# Patient Record
Sex: Female | Born: 1970 | Race: White | Hispanic: No | Marital: Married | Smoking: Never smoker
Health system: Southern US, Community
[De-identification: ages and names within clinical notes are randomized; demographics above are authoritative.]

## PROBLEM LIST (undated history)

## (undated) DIAGNOSIS — N952 Postmenopausal atrophic vaginitis: Secondary | ICD-10-CM

## (undated) HISTORY — PX: ABDOMINAL HYSTERECTOMY: SHX81

## (undated) HISTORY — DX: Postmenopausal atrophic vaginitis: N95.2

---

## 2006-12-17 ENCOUNTER — Encounter: Admission: RE | Admit: 2006-12-17 | Discharge: 2006-12-17 | Payer: Self-pay | Admitting: Family Medicine

## 2014-03-13 LAB — HEPATIC FUNCTION PANEL
ALT: 17 U/L (ref 7–35)
AST: 21 U/L (ref 13–35)
Alkaline Phosphatase: 70 U/L (ref 25–125)
BILIRUBIN, TOTAL: 0.2 mg/dL

## 2014-03-13 LAB — TSH: TSH: 2.54 u[IU]/mL (ref <=5.90)

## 2014-03-13 LAB — HEMOGLOBIN A1C: Hgb A1c MFr Bld: 5.3 % (ref 4.0–6.0)

## 2014-03-13 LAB — CBC AND DIFFERENTIAL
HCT: 40 % (ref 36–46)
Hemoglobin: 13.6 g/dL (ref 12.0–16.0)
PLATELETS: 147 10*3/uL — AB (ref 150–399)
WBC: 5 10*3/mL

## 2014-03-13 LAB — BASIC METABOLIC PANEL
BUN: 17 mg/dL (ref 4–21)
Creatinine: 0.9 mg/dL (ref 0.5–1.1)
Glucose: 74 mg/dL
POTASSIUM: 4.4 mmol/L (ref 3.4–5.3)
Sodium: 142 mmol/L (ref 137–147)

## 2014-03-26 ENCOUNTER — Ambulatory Visit (INDEPENDENT_AMBULATORY_CARE_PROVIDER_SITE_OTHER): Payer: BC Managed Care – PPO | Admitting: Physician Assistant

## 2014-03-26 ENCOUNTER — Encounter: Payer: Self-pay | Admitting: Physician Assistant

## 2014-03-26 VITALS — BP 107/64 | HR 66 | Ht 64.5 in | Wt 156.0 lb

## 2014-03-26 DIAGNOSIS — M533 Sacrococcygeal disorders, not elsewhere classified: Secondary | ICD-10-CM | POA: Insufficient documentation

## 2014-03-26 DIAGNOSIS — D696 Thrombocytopenia, unspecified: Secondary | ICD-10-CM | POA: Diagnosis not present

## 2014-03-26 DIAGNOSIS — N951 Menopausal and female climacteric states: Secondary | ICD-10-CM | POA: Diagnosis not present

## 2014-03-26 DIAGNOSIS — Z13220 Encounter for screening for lipoid disorders: Secondary | ICD-10-CM

## 2014-03-26 LAB — LIPID PANEL
Cholesterol: 198 mg/dL (ref 0–200)
HDL: 80 mg/dL (ref >39)
LDL CALC: 103 mg/dL — AB (ref 0–99)
Total CHOL/HDL Ratio: 2.5 Ratio
Triglycerides: 73 mg/dL (ref <150)
VLDL: 15 mg/dL (ref 0–40)

## 2014-03-26 MED ORDER — DICLOFENAC SODIUM 1 % TD GEL: 4.0000 g | Freq: Four times a day (QID) | TRANSDERMAL | Status: DC

## 2014-03-26 NOTE — Patient Instructions (Signed)
Sacroiliac Joint Dysfunction °The sacroiliac joint connects the lower part of the spine (the sacrum) with the bones of the pelvis. °CAUSES  °Sometimes, there is no obvious reason for sacroiliac joint dysfunction. Other times, it may occur  °· During pregnancy. °· After injury, such as: °¨ Car accidents. °¨ Sport-related injuries. °¨ Work-related injuries. °· Due to one leg being shorter than the other. °· Due to other conditions that affect the joints, such as: °¨ Rheumatoid arthritis. °¨ Gout. °¨ Psoriasis. °¨ Joint infection (septic arthritis). °SYMPTOMS  °Symptoms may include: °· Pain in the: °¨ Lower back. °¨ Buttocks. °¨ Groin. °¨ Thighs and legs. °· Difficult sitting, standing, walking, lying, bending or lifting. °DIAGNOSIS  °A number of tests may be used to help diagnose the cause of sacroiliac joint dysfunction, including: °· Imaging tests to look for other causes of pain, including: °¨ MRI. °¨ CT scan. °¨ Bone scan. °· Diagnostic injection: During a special x-ray (called fluoroscopy), a needle is put into the sacroiliac joint. A numbing medicine is injected into the joint. If the pain is improved or stopped, the diagnosis of sacroiliac joint dysfunction is more likely. °TREATMENT  °There are a number of types of treatment used for sacroiliac joint dysfunction, including: °· Only take over-the-counter or prescription medicines for pain, discomfort, or fever as directed by your caregiver. °· Medications to relax muscles. °· Rest. Decreasing activity can help cut down on painful muscle spasms and allow the back to heal. °· Application of heat or ice to the lower back may improve muscle spasms and soothe pain. °· Brace. A special back brace, called a sacroiliac belt, can help support the joint while your back is healing. °· Physical therapy can help teach comfortable positions and exercises to strengthen muscles that support the sacroiliac joint. °· Cortisone injections. Injections of steroid medicine into the  joint can help decrease swelling and improve pain. °· Hyaluronic acid injections. This chemical improves lubrication within the sacroiliac joint, thereby decreasing pain. °· Radiofrequency ablation. A special needle is placed into the joint, where it burns away nerves that are carrying pain messages from the joint. °· Surgery. Because pain occurs during movement of the joint, screws and plates may be installed in order to limit or prevent joint motion. °HOME CARE INSTRUCTIONS  °· Take all medications exactly as directed. °· Follow instructions regarding both rest and physical activity, to avoid worsening the pain. °· Do physical therapy exercises exactly as prescribed. °SEEK IMMEDIATE MEDICAL CARE IF: °· You experience increasingly severe pain. °· You develop new symptoms, such as numbness or tingling in your legs or feet. °· You lose bladder or bowel control. °Document Released: 09/04/2008 Document Revised: 08/31/2011 Document Reviewed: 09/04/2008 °ExitCare® Patient Information ©2015 [employer]. This information is not intended to replace advice given to you by your health care provider. Make sure you discuss any questions you have with your health care provider. ° °

## 2014-03-27 NOTE — Progress Notes (Signed)
   Subjective:    Patient ID: Tracy Franklin, female    DOB: 07/26/1970, 43 y.o.   MRN: [id number]  HPI Pt is a [age] yo female who presents to the clinic to establish care.   PMH positive for menopausal symptoms. Pt had complete hysterectomy in 2002 for painful heavy periods. She has since had symptoms. Tried HRT for a little while and hated it. Then found cream through [facility] and loved it. Used for 10 years. Just recently discontinued. She has now gone to hormone specialist and working with them to figure something out.   She is also having some occasional right low back pain. Sometimes radiates into buttocks. Not really tried anything for it. Comes and goes. She is very active and notices hurts worse after certain activities.    Review of Systems  All other systems reviewed and are negative.      Objective:   Physical Exam  Constitutional: She is oriented to person, place, and time. She appears well-developed and well-nourished.  HENT:  Head: Normocephalic and atraumatic.  Cardiovascular: Normal rate, regular rhythm and normal heart sounds.   Pulmonary/Chest: Effort normal and breath sounds normal. She has no wheezes.  Musculoskeletal:  Normal ROM at waist.  Tenderness to palpation over right SI joint.  Negative straight leg test.  Normal bilateral patellar reflexes.   Neurological: She is alert and oriented to person, place, and time.  Skin: Skin is dry.  Psychiatric: She has a normal mood and affect. Her behavior is normal.          Assessment & Plan:  SI joint dysfunction- Ibuprofen as needed. Give voltaren gel to try over the area. Stretches given to start to rehabilitate.declined imaging today. RTC if not improving or if symptoms worsening.   Menopausal symptoms- pt is actually  working with a specialist and they are composting a plan of action for bio-identical hormones. She brings in labs.  Low platelets- platelets a little low. She does admit to easy bruising. This  certainly could be the cause. No other abnormality on cbc. Recommend recheck in 1-2 months to make sure no other changes on CBC.   Vitamin D insuffiencey taking OTC vit d 2000units currently.   Screening lab for fasting cholesterol. Never drawn needs drawn.    Pt declines flu shot today.

## 2014-03-28 ENCOUNTER — Encounter: Payer: Self-pay | Admitting: Physician Assistant

## 2014-05-07 ENCOUNTER — Ambulatory Visit (INDEPENDENT_AMBULATORY_CARE_PROVIDER_SITE_OTHER): Payer: BC Managed Care – PPO | Admitting: Physician Assistant

## 2014-05-07 ENCOUNTER — Encounter: Payer: Self-pay | Admitting: Physician Assistant

## 2014-05-07 VITALS — BP 107/55 | HR 69 | Ht 65.5 in | Wt 154.0 lb

## 2014-05-07 DIAGNOSIS — Z23 Encounter for immunization: Secondary | ICD-10-CM | POA: Diagnosis not present

## 2014-05-07 DIAGNOSIS — Z7184 Encounter for health counseling related to travel: Secondary | ICD-10-CM

## 2014-05-07 DIAGNOSIS — Z7189 Other specified counseling: Secondary | ICD-10-CM | POA: Diagnosis not present

## 2014-05-07 DIAGNOSIS — N951 Menopausal and female climacteric states: Secondary | ICD-10-CM | POA: Insufficient documentation

## 2014-05-07 MED ORDER — ATOVAQUONE-PROGUANIL HCL 250-100 MG PO TABS: 1.0000 | ORAL_TABLET | Freq: Every day | ORAL | Status: DC

## 2014-05-07 MED ORDER — TYPHOID VACCINE PO CPDR: 1.0000 | DELAYED_RELEASE_CAPSULE | ORAL | Status: DC

## 2014-05-07 NOTE — Progress Notes (Signed)
   Subjective:    Patient ID: Tracy Franklin, female    DOB: November 27, 1970, 43 y.o.   MRN: [id number]  HPI Patient is a 43 year old female who presents to the clinic to discuss vaccines for a mission sure that she will go on in mid January for 10 days to [place]. 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   Review of Systems  All other systems reviewed and are negative.      Objective:   Physical Exam  Constitutional: She is oriented to person, place, and time. She appears well-developed and well-nourished.  HENT:  Head: Normocephalic and atraumatic.  Cardiovascular: Normal rate, regular rhythm and normal heart sounds.   Neurological: She is alert and oriented to person, place, and time.  Psychiatric: She has a normal mood and affect. Her behavior is normal.          Assessment & Plan:  Vaccines for travel- Tdap up to date. flu shot given today with no complication. First dose of hep A and hep B given today. She will come back in 4 weeks for second dose of hep B. She is aware that she will not be able complete series before she travels. She was given typhoid oral vaccine as well as malaria preventative vaccine for 2 days before and 7 days after trip. Reminded to bring DEET bug spray.

## 2014-06-06 ENCOUNTER — Ambulatory Visit (INDEPENDENT_AMBULATORY_CARE_PROVIDER_SITE_OTHER): Payer: BC Managed Care – PPO | Admitting: Physician Assistant

## 2014-06-06 DIAGNOSIS — Z23 Encounter for immunization: Secondary | ICD-10-CM

## 2014-06-06 NOTE — Progress Notes (Signed)
   Subjective:    Patient ID: Tracy Franklin, female    DOB: 03-31-1971, 43 y.o.   MRN: [id number]  HPI  Delice Bisonara is here for Twinrix vaccine.   Review of Systems     Objective:   Physical Exam        Assessment & Plan:  Patient tolerated injection well without complications. Patient advised to schedule next injection 150 days from today.

## 2014-07-17 ENCOUNTER — Ambulatory Visit (INDEPENDENT_AMBULATORY_CARE_PROVIDER_SITE_OTHER): Payer: BLUE CROSS/BLUE SHIELD | Admitting: Physician Assistant

## 2014-07-17 ENCOUNTER — Encounter: Payer: Self-pay | Admitting: Physician Assistant

## 2014-07-17 VITALS — BP 114/76 | HR 73 | Temp 98.0°F | Ht 65.5 in | Wt 154.0 lb

## 2014-07-17 DIAGNOSIS — A499 Bacterial infection, unspecified: Secondary | ICD-10-CM | POA: Diagnosis not present

## 2014-07-17 DIAGNOSIS — J329 Chronic sinusitis, unspecified: Secondary | ICD-10-CM

## 2014-07-17 DIAGNOSIS — B9689 Other specified bacterial agents as the cause of diseases classified elsewhere: Secondary | ICD-10-CM

## 2014-07-17 DIAGNOSIS — R21 Rash and other nonspecific skin eruption: Secondary | ICD-10-CM | POA: Diagnosis not present

## 2014-07-17 MED ORDER — HYDROCODONE-HOMATROPINE 5-1.5 MG/5ML PO SYRP: 5.0000 mL | ORAL_SOLUTION | Freq: Every evening | ORAL | Status: DC | PRN

## 2014-07-17 MED ORDER — AMOXICILLIN-POT CLAVULANATE 875-125 MG PO TABS: 1.0000 | ORAL_TABLET | Freq: Two times a day (BID) | ORAL | Status: DC

## 2014-07-17 MED ORDER — PREDNISONE 50 MG PO TABS: ORAL_TABLET | ORAL | Status: DC

## 2014-07-17 NOTE — Patient Instructions (Signed)

## 2014-07-17 NOTE — Progress Notes (Signed)
   Subjective:    Patient ID: Tracy Franklin, female    DOB: 28-Aug-1970, 44 y.o.   MRN: [id number]  HPI  Patient is a [age] year old female who presents to the clinic with cough, headache, nasal congestion. She just got back from a 10 day trip from [place]. There was a lot of dust in the country and she had experienced a lot of temperature changes. She started getting sick in [place] on the flight back. She has tons of sinus pressure, facial pain and her cough seems to be worsening. It is somewhat productive but she's having trouble getting stuffed up. She denies any wheezing but her chest does feel tight and short of breath. She's tried over-the-counter cough syrup, Alka-Seltzer plus nighttime with little benefit. She also noticed a rash that started this morning that is itchy over her torso and hands. She does not remember any type of insect bite. She is currently on malaria prophylaxis.   Review of Systems  All other systems reviewed and are negative.      Objective:   Physical Exam  Constitutional: She is oriented to person, place, and time. She appears well-developed and well-nourished.  HENT:  Head: Normocephalic and atraumatic.  Right Ear: External ear normal.  Left Ear: External ear normal.  Mouth/Throat: Oropharynx is clear and moist.  TMs are slightly bulging with some erythema. No blood or pus noted.  There is maxillary tenderness to palpation over sinuses.  Bilateral nares are red and swollen turbinates with some rhinorrhea.  Eyes: Conjunctivae are normal. Right eye exhibits no discharge. Left eye exhibits no discharge.  Neck: Normal range of motion. Neck supple.  Cardiovascular: Normal rate, regular rhythm and normal heart sounds.   Pulmonary/Chest: Effort normal and breath sounds normal. She has no wheezes.  Lymphadenopathy:    She has no cervical adenopathy.  Neurological: She is alert and oriented to person, place, and time.  Skin:  There is a macular flat erythematous rash  over neck, torso and hands. No lesions or vesicles. No nodules or plaques.  Psychiatric: She has a normal mood and affect. Her behavior is normal.          Assessment & Plan:  Acute sinuitis- given Augmentin for 10 days. For cough given Hycodan to use at night. Encouraged cough drops with honey and lemon. The day. Gave handout further symptomatic care. Discussed adding Mucinex D for some mucus anything and relief.  Rash- unclear etiology. Appears almost like a drug reaction vs viral exanthem. Pt been on malaria prophylaxis. Pt has been out of country and could have been bitten by something like caused rash. For itch use OTC hydrocortisone cream. Prednisone given if not clearing or becoming bothersome in next couple of days.

## 2014-07-18 ENCOUNTER — Encounter: Payer: Self-pay | Admitting: Physician Assistant

## 2014-09-04 ENCOUNTER — Encounter: Payer: Self-pay | Admitting: Physician Assistant

## 2014-11-02 ENCOUNTER — Encounter: Payer: Self-pay | Admitting: Sports Medicine

## 2014-11-02 ENCOUNTER — Ambulatory Visit (INDEPENDENT_AMBULATORY_CARE_PROVIDER_SITE_OTHER): Payer: BLUE CROSS/BLUE SHIELD | Admitting: Sports Medicine

## 2014-11-02 ENCOUNTER — Ambulatory Visit (INDEPENDENT_AMBULATORY_CARE_PROVIDER_SITE_OTHER): Payer: BLUE CROSS/BLUE SHIELD

## 2014-11-02 VITALS — BP 115/71 | HR 67 | Ht 65.0 in | Wt 156.0 lb

## 2014-11-02 DIAGNOSIS — M7752 Other enthesopathy of left foot: Secondary | ICD-10-CM

## 2014-11-02 DIAGNOSIS — M545 Low back pain: Secondary | ICD-10-CM

## 2014-11-02 DIAGNOSIS — M25572 Pain in left ankle and joints of left foot: Secondary | ICD-10-CM | POA: Diagnosis not present

## 2014-11-02 DIAGNOSIS — M47816 Spondylosis without myelopathy or radiculopathy, lumbar region: Secondary | ICD-10-CM | POA: Insufficient documentation

## 2014-11-02 DIAGNOSIS — M5416 Radiculopathy, lumbar region: Secondary | ICD-10-CM

## 2014-11-02 DIAGNOSIS — M7750 Other enthesopathy of unspecified foot: Secondary | ICD-10-CM | POA: Insufficient documentation

## 2014-11-02 MED ORDER — MELOXICAM 15 MG PO TABS: ORAL_TABLET | ORAL | Status: DC

## 2014-11-02 NOTE — Progress Notes (Signed)
   Subjective:    I'm seeing this patient as a consultation for:  Tandy GawJade Breeback, PA-C  CC: Left foot pain  HPI: For the past couple of weeks this pleasant 44 year old female has had increasing pain and she localizes just deep to the posterior heel, moderate, persistent, she is highly active with spin classes, and has noted worsening of symptoms during these episodes. Pain does not radiate.  She also endorses mild back pain with numbness and tingling radiating down the left leg directly in an S1 distribution. No bowel or bladder dysfunction or saddle numbness, no constitutional symptoms.  Past medical history, Surgical history, Family history not pertinant except as noted below, Social history, Allergies, and medications have been entered into the medical record, reviewed, and no changes needed.   Review of Systems: No headache, visual changes, nausea, vomiting, diarrhea, constipation, dizziness, abdominal pain, skin rash, fevers, chills, night sweats, weight loss, swollen lymph nodes, body aches, joint swelling, muscle aches, chest pain, shortness of breath, mood changes, visual or auditory hallucinations.   Objective:   General: Well Developed, well nourished, and in no acute distress.  Neuro/Psych: Alert and oriented x3, extra-ocular muscles intact, able to move all 4 extremities, sensation grossly intact. Skin: Warm and dry, no rashes noted.  Respiratory: Not using accessory muscles, speaking in full sentences, trachea midline.  Cardiovascular: Pulses palpable, no extremity edema. Abdomen: Does not appear distended. Back Exam:  Inspection: Unremarkable  Motion: Flexion 45 deg, Extension 45 deg, Side Bending to 45 deg bilaterally,  Rotation to 45 deg bilaterally  SLR laying: Negative  XSLR laying: Negative  Palpable tenderness: None. FABER: negative. Sensory change: Gross sensation intact to all lumbar and sacral dermatomes.  Reflexes: 2+ at both patellar tendons, 2+ at achilles  tendons, Babinski's downgoing.  Strength at foot  Plantar-flexion: 5/5 Dorsi-flexion: 5/5 Eversion: 5/5 Inversion: 5/5  Leg strength  Quad: 5/5 Hamstring: 5/5 Hip flexor: 5/5 Hip abductors: 5/5  Gait unremarkable. Left Foot: No visible erythema or swelling. Range of motion is full in all directions. Strength is 5/5 in all directions. No hallux valgus. No pes cavus or pes planus. No abnormal callus noted. No pain over the navicular prominence, or base of fifth metatarsal. No tenderness to palpation of the calcaneal insertion of plantar fascia. No pain at the Achilles insertion. No pain over the calcaneal bursa. Positive reproduction of pain at the retrocalcaneal bursa. No tenderness to palpation over the tarsals, metatarsals, or phalanges. No hallux rigidus or limitus. No tenderness palpation over interphalangeal joints. No pain with compression of the metatarsal heads. Neurovascularly intact distally.  Impression and Recommendations:   This case required medical decision making of moderate complexity.

## 2014-11-02 NOTE — Assessment & Plan Note (Signed)
Left S1.  Formal PT, meloxicam, x-rays.

## 2014-11-02 NOTE — Assessment & Plan Note (Signed)
X-rays, formal physical therapy, meloxicam. Heel lifts. Formal physical therapy.

## 2014-11-05 ENCOUNTER — Ambulatory Visit: Payer: BC Managed Care – PPO

## 2014-11-14 ENCOUNTER — Ambulatory Visit (INDEPENDENT_AMBULATORY_CARE_PROVIDER_SITE_OTHER): Payer: BLUE CROSS/BLUE SHIELD | Admitting: Rehabilitative and Restorative Service Providers"

## 2014-11-14 DIAGNOSIS — R29898 Other symptoms and signs involving the musculoskeletal system: Secondary | ICD-10-CM

## 2014-11-14 DIAGNOSIS — M25572 Pain in left ankle and joints of left foot: Secondary | ICD-10-CM

## 2014-11-14 DIAGNOSIS — M545 Low back pain, unspecified: Secondary | ICD-10-CM

## 2014-11-14 DIAGNOSIS — M25659 Stiffness of unspecified hip, not elsewhere classified: Secondary | ICD-10-CM

## 2014-11-14 DIAGNOSIS — M79605 Pain in left leg: Secondary | ICD-10-CM

## 2014-11-14 NOTE — Patient Instructions (Signed)
Trunk: Prone Extension (Press-Ups)   Lie on stomach on firm, flat surface. Relax bottom and legs. Raise chest in air with elbows straight. Keep hips flat on surface, sag stomach. Hold _1___ seconds. Repeat __10__ times. Do __2-3__ sessions per day. CAUTION: Movement should be gentle and slow.  Piriformis Stretch   Lying on back, pull right knee toward opposite shoulder. Hold __20-30__ seconds. Repeat __3__ times. Do __2-3__ sessions per day.   SEATED Gastroc / Heel Cord Stretch - Seated With Towel  Achilles / Gastroc, Standing   Stand, right foot behind, heel on floor and turned slightly out, leg straight, forward leg bent. Move hips forward. Hold _30__ seconds. Repeat _3__ times per session. Do _3__ sessions per day.   Stretching: Soleus   Stand with right foot back, both knees bent. Keeping heel on floor, turned slightly out, lean into wall until stretch is felt in lower calf. Hold _30___ seconds. Repeat __3__ times per set. Do __3__ sessions per day.   Abdominal Bracing With Pelvic Floor (Hook-Lying)   With neutral spine, tighten pelvic floor and abdominals. Hold 10 seconds. Repeat __10_ times. Do _1__ times a day.   [facility] [address] [place], [place] [address]  [phone] (office) [phone] (fax)

## 2014-11-14 NOTE — Patient Instructions (Signed)
Trunk: Prone Extension (Press-Ups)   Lie on stomach on firm, flat surface. Relax bottom and legs. Raise chest in air with elbows straight. Keep hips flat on surface, sag stomach. Hold _1___ seconds. Repeat __10__ times. Do __2-3__ sessions per day. CAUTION: Movement should be gentle and slow.  Piriformis Stretch   Lying on back, pull right knee toward opposite shoulder. Hold __20-30__ seconds. Repeat __3__ times. Do __2-3__ sessions per day.   SEATED Gastroc / Heel Cord Stretch - Seated With Towel  Achilles / Gastroc, Standing   Stand, right foot behind, heel on floor and turned slightly out, leg straight, forward leg bent. Move hips forward. Hold _30__ seconds. Repeat _3__ times per session. Do _3__ sessions per day.   Stretching: Soleus   Stand with right foot back, both knees bent. Keeping heel on floor, turned slightly out, lean into wall until stretch is felt in lower calf. Hold _30___ seconds. Repeat __3__ times per set. Do __3__ sessions per day.   Abdominal Bracing With Pelvic Floor (Hook-Lying)   With neutral spine, tighten pelvic floor and abdominals. Hold 10 seconds. Repeat __10_ times. Do _1__ times a day.   [facility] [address] [place], [place] [address]  [phone] (office) [phone] (fax)   

## 2014-11-15 NOTE — Therapy (Addendum)
[place], [place], [address] Phone: [phone]   Fax:  [phone]  Physical Therapy Evaluation  Patient Details  Name: Tracy Franklin MRN: [id number] Date of Birth: 06-28-1970 Referring Provider:  Silverio Decamp,*  Encounter Date: 11/14/2014      PT End of Session - 11/14/14 1709    Visit Number 1   Number of Visits 8   Date for PT Re-Evaluation 01/04/15   PT Start Time 0356   PT Stop Time 0500   PT Time Calculation (min) 64 min   Activity Tolerance Patient tolerated treatment well;No increased pain      No past medical history on file.  Past Surgical History  Procedure Laterality Date  . Abdominal hysterectomy      There were no vitals filed for this visit.  Visit Diagnosis:  Pain in joint, ankle and foot, left - Plan: PT plan of care cert/re-cert  Lumbar pain with radiation down left leg - Plan: PT plan of care cert/re-cert  Weakness of left hip - Plan: PT plan of care cert/re-cert  Stiffness of hip joint, unspecified laterality - Plan: PT plan of care cert/re-cert      Subjective Assessment - 11/14/14 1604    Subjective Pt reports onset of Lt heel pain ~ 5 weeks ago when cycyling intensity increased stopped exercise ~ 10 days ago seen by MD - Also reports fall ~10 years ago striking L hip/buttock with continued symptoms inc LBP, intermittent Lt hip pain and tingling in laterar foot into the little toe    Pertinent History Now cycling again has adjusted foot pedel wearing tennis shoes per me advise.No treatment following fall - symptoms have continued but have not limited activity   How long can you sit comfortably? 30 minutes   How long can you stand comfortably? 10-15 minutes   How long can you walk comfortably? 10 minutes   Diagnostic tests xrays - foot and back    Patient Stated Goals to be able to resume exercise without pain; improve symptoms LB and LT LE   Currently  in Pain? Yes   Pain Score 3    Pain Location Ankle   Pain Orientation Left   Pain Descriptors / Indicators Aching;Sharp   Pain Type Acute pain   Pain Onset More than a month ago   Pain Frequency Constant   Aggravating Factors  cycling, point toes down, walking    Pain Relieving Factors avoiding activities that irritate symptoms, NSAIDS, essential oils   Effect of Pain on Daily Activities changes to ADL's - re- exercise and walking    Multiple Pain Sites Yes            [facility] PT Assessment - 11/14/14 1726    Assessment   Medical Diagnosis lt lumbar radiculopathy; retrocalcaneal bursitis lt   Onset Date/Surgical Date 10/10/14   Hand Dominance Right   Prior Function   Level of Independence Independent with basic ADLs   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    Leisure exercise - spin/bike/weights   Observation/Other Assessments   Focus on Therapeutic Outcomes (FOTO)  37% limitation   Sensation   Additional Comments intermittent tingling Lt lateral foot into little toe   Posture/Postural Control   Posture/Postural Control No significant limitations   Posture Comments symmetrical at boney landmarks   AROM   Right Ankle Dorsiflexion 5   Right Ankle Plantar Flexion 65   Right Ankle Inversion 35  Right Ankle Eversion 30   Left Ankle Dorsiflexion 3   Left Ankle Plantar Flexion 60   Left Ankle Inversion 30   Left Ankle Eversion 22   Strength   Overall Strength Comments LE strength 5/5 throughout B LE's; except L hip abduction and extension 4+/5   Flexibility   Soft Tissue Assessment /Muscle Length yes   Piriformis tightness noted bilaterally   Quadratus Lumborum tight Rt   Palpation   Spinal mobility WFL's PA spring testing lumbar spine   SI assessment  pain Rt SI with spring testing    Special Tests    Special Tests --  tightness with piriformis stretch position bilat Lt > Rt   Ambulation/Gait   Gait Comments WNL's - no abnormal gait pattern  noted                           PT Education - 11/14/14 1707    Education provided Yes   Education Details Education re- lumbar spine pathology, foot and ankle pain; HEP; activities to avoid; footwear   Person(s) Educated Patient   Methods Explanation;Demonstration;Tactile cues;Verbal cues;Handout   Comprehension Verbalized understanding;Returned demonstration;Verbal cues required          PT Short Term Goals - 11/14/14 1716    PT SHORT TERM GOAL #1   Title Pt independent in initial HEP   Time 3   Period Weeks   Status New   PT SHORT TERM GOAL #2   Title Increase hip mobility through piriformis bilaterally   Time 4   Period Weeks   Status New   PT SHORT TERM GOAL #3   Title decrease pain Lt ankle/foot to 1-2/10 for functional activities and ambulation   Time 4   Period Weeks   Status New           PT Long Term Goals - 11/14/14 1738    PT LONG TERM GOAL #1   Title Improve core stability allowing pt to preform functional activities without pain   Time 8   Period Weeks   Status New   PT LONG TERM GOAL #2   Title Increase strength L hip abd/ext to 5/5   Time 8   Period Weeks   Status New   PT LONG TERM GOAL #3   Title Increase ankle ROM Lt to equal Rt   Time 8   Period Weeks   Status New   PT LONG TERM GOAL #4   Title Decrease FOTO score to 25 or less   Time 8   Period Weeks   Status New               Plan - 11/14/14 1711    Clinical Impression Statement Pt presents with acute ankle/heel pain likely related to spin exercise on cycle; chronic Lt lumbar radicular symptoms over the past 10 years following a fall - with intermittent symptoms    Pt will benefit from skilled therapeutic intervention in order to improve on the following deficits Decreased range of motion;Increased fascial restricitons;Decreased activity tolerance;Pain;Impaired flexibility;Decreased strength   Rehab Potential Excellent   PT Frequency 1x / week   PT  Duration 8 weeks   PT Treatment/Interventions ADLs/Self Care Home Management;Cryotherapy;Electrical Stimulation;Iontophoresis 28m/ml Dexamethasone;Moist Heat;Ultrasound;Therapeutic activities;Therapeutic exercise;Neuromuscular re-education;Patient/family education;Manual techniques   PT Next Visit Plan Patient will be seen for re-check of current exercises; progression of HEP; stretching piriformis, gastroc, soleus; core stabilization   PT Home Exercise Plan HEP  Consulted and Agree with Plan of Care Patient         Problem List Patient Active Problem List   Diagnosis Date Noted  . Retrocalcaneal bursitis 11/02/2014  . Left lumbar radiculitis 11/02/2014  . Menopausal symptoms 05/07/2014  . SI (sacroiliac) joint dysfunction 03/26/2014    [place] Nilda Simmer, PT, MPH 11/15/2014, 8:08 AM  [facility] [place] [address] [place], [place], [address] Phone: [phone]   Fax:  [phone]    PHYSICAL THERAPY DISCHARGE SUMMARY  Visits from Start of Care: 1  Current functional level related to goals / functional outcomes: Seen for Initial Evaluation only   Remaining deficits: Unknown   Education / Equipment: HEP  Plan: Patient agrees to discharge.  Patient goals were not met. Patient is being discharged due to not returning since the last visit.  ?????   Irvin Bastin P. Helene Kelp, PT. MPH 01/11/15 11:00am

## 2014-11-22 ENCOUNTER — Encounter: Payer: BLUE CROSS/BLUE SHIELD | Admitting: Rehabilitative and Restorative Service Providers"

## 2014-12-04 ENCOUNTER — Ambulatory Visit: Payer: BLUE CROSS/BLUE SHIELD | Admitting: Sports Medicine

## 2014-12-11 ENCOUNTER — Ambulatory Visit: Payer: BLUE CROSS/BLUE SHIELD | Admitting: Sports Medicine

## 2014-12-17 ENCOUNTER — Ambulatory Visit (INDEPENDENT_AMBULATORY_CARE_PROVIDER_SITE_OTHER): Payer: BLUE CROSS/BLUE SHIELD | Admitting: Sports Medicine

## 2014-12-17 ENCOUNTER — Encounter: Payer: Self-pay | Admitting: Sports Medicine

## 2014-12-17 DIAGNOSIS — M5416 Radiculopathy, lumbar region: Secondary | ICD-10-CM

## 2014-12-17 DIAGNOSIS — M7752 Other enthesopathy of left foot: Secondary | ICD-10-CM | POA: Diagnosis not present

## 2014-12-17 NOTE — Progress Notes (Signed)
  Subjective:    CC: Follow-up  HPI: Left lumbar developed the: Clinical S1, she did have some L3-L4 spondylolisthesis, unfortunately pain is continued despite physical therapy.  Retrocalcaneal bursitis, left: Improved with physical therapy but persistent.  Past medical history, Surgical history, Family history not pertinant except as noted below, Social history, Allergies, and medications have been entered into the medical record, reviewed, and no changes needed.   Review of Systems: No fevers, chills, night sweats, weight loss, chest pain, or shortness of breath.   Objective:    General: Well Developed, well nourished, and in no acute distress.  Neuro: Alert and oriented x3, extra-ocular muscles intact, sensation grossly intact.  HEENT: Normocephalic, atraumatic, pupils equal round reactive to light, neck supple, no masses, no lymphadenopathy, thyroid nonpalpable.  Skin: Warm and dry, no rashes. Cardiac: Regular rate and rhythm, no murmurs rubs or gallops, no lower extremity edema.  Respiratory: Clear to auscultation bilaterally. Not using accessory muscles, speaking in full sentences. Left Ankle: No visible erythema or swelling. Range of motion is full in all directions. Strength is 5/5 in all directions. Stable lateral and medial ligaments; squeeze test and kleiger test unremarkable; Talar dome nontender; No pain at base of 5th MT; No tenderness over cuboid; No tenderness over N spot or navicular prominence No tenderness on posterior aspects of lateral and medial malleolus No sign of peroneal tendon subluxations; Negative tarsal tunnel tinel's Still with tenderness just deep to the Achilles insertion  Impression and Recommendations:

## 2014-12-17 NOTE — Assessment & Plan Note (Signed)
Improved but persistent despite 4-6 weeks of formal physical therapy as well as oral medications. Before injection I do confirm the diagnosis. MR left foot

## 2014-12-17 NOTE — Assessment & Plan Note (Signed)
Left-sided S1 with some upper lumbar spondylolisthesis. Again, has failed 6 weeks of formal physical therapy as well as oral medications. MRI for interventional planning.

## 2014-12-25 ENCOUNTER — Ambulatory Visit (INDEPENDENT_AMBULATORY_CARE_PROVIDER_SITE_OTHER): Payer: BLUE CROSS/BLUE SHIELD

## 2014-12-25 DIAGNOSIS — M5126 Other intervertebral disc displacement, lumbar region: Secondary | ICD-10-CM

## 2014-12-25 DIAGNOSIS — M5416 Radiculopathy, lumbar region: Secondary | ICD-10-CM

## 2014-12-25 DIAGNOSIS — M4686 Other specified inflammatory spondylopathies, lumbar region: Secondary | ICD-10-CM

## 2014-12-25 DIAGNOSIS — M7752 Other enthesopathy of left foot: Secondary | ICD-10-CM | POA: Diagnosis not present

## 2015-01-03 ENCOUNTER — Ambulatory Visit (INDEPENDENT_AMBULATORY_CARE_PROVIDER_SITE_OTHER): Payer: BLUE CROSS/BLUE SHIELD | Admitting: Sports Medicine

## 2015-01-03 ENCOUNTER — Encounter: Payer: Self-pay | Admitting: Sports Medicine

## 2015-01-03 VITALS — BP 110/71 | HR 58 | Wt 160.0 lb

## 2015-01-03 DIAGNOSIS — M7752 Other enthesopathy of left foot: Secondary | ICD-10-CM

## 2015-01-03 DIAGNOSIS — M47816 Spondylosis without myelopathy or radiculopathy, lumbar region: Secondary | ICD-10-CM | POA: Diagnosis not present

## 2015-01-03 NOTE — Assessment & Plan Note (Signed)
MRI does show an element of retrocalcaneal bursitis, there is a significant effusion posterior to the talus, posterior ankle recess. Injection placed into the recess, fluid does track into the retrocalcaneal bursa. Return in one month.

## 2015-01-03 NOTE — Assessment & Plan Note (Signed)
Bilateral L3-L4. We are going to set her up for bilateral L3-L4 facet injections for diagnostic and therapeutic purposes.

## 2015-01-03 NOTE — Progress Notes (Signed)
  Subjective:    CC:  Follow-up MRI  HPI: Low back pain: MRI does not show any evidence of nerve root compromise, however there is severe L3-L4 facet arthritis, other facets look okay. Pain is moderate, persistent.  Ankle pain: MRI shows effusion in the posterior ankle joint recess tracking down into the retrocalcaneal bursa distal to the fat pad. Pain is moderate, persistent and worse with resisted ankle plantar flexion.  Past medical history, Surgical history, Family history not pertinant except as noted below, Social history, Allergies, and medications have been entered into the medical record, reviewed, and no changes needed.   Review of Systems: No fevers, chills, night sweats, weight loss, chest pain, or shortness of breath.   Objective:    General: Well Developed, well nourished, and in no acute distress.  Neuro: Alert and oriented x3, extra-ocular muscles intact, sensation grossly intact.  HEENT: Normocephalic, atraumatic, pupils equal round reactive to light, neck supple, no masses, no lymphadenopathy, thyroid nonpalpable.  Skin: Warm and dry, no rashes. Cardiac: Regular rate and rhythm, no murmurs rubs or gallops, no lower extremity edema.  Respiratory: Clear to auscultation bilaterally. Not using accessory muscles, speaking in full sentences.  MRI reviewed, and the lumbar spine there is evidence of any radicular/foraminal compromise, she does have severe L3-L4 bilateral facet arthritis. In her ankle on the left she does have evidence of a posterior ankle recess effusion tracking down into the retrocalcaneal bursa. There is intense T2 signal here.  Procedure: Real-time Ultrasound Guided Injection of left posterior ankle recess Device: GE Logiq E  Verbal informed consent obtained.  Time-out conducted.  Noted no overlying erythema, induration, or other signs of local infection.  Skin prepped in a sterile fashion.  Local anesthesia: Topical Ethyl chloride.  With sterile  technique and under real time ultrasound guidance:  Visualized the recess deep to the Achilles tendon and just superficial to the posterior talus, 25-gauge needle advanced into the structure and 1 mL kenalog 40, 2 mL lidocaine injected easily. Completed without difficulty  Pain immediately resolved suggesting accurate placement of the medication.  Advised to call if fevers/chills, erythema, induration, drainage, or persistent bleeding.  Images permanently stored and available for review in the ultrasound unit.  Impression: Technically successful ultrasound guided injection.  Impression and Recommendations:

## 2015-01-08 ENCOUNTER — Encounter: Payer: Self-pay | Admitting: Sports Medicine

## 2015-01-31 ENCOUNTER — Ambulatory Visit: Payer: BLUE CROSS/BLUE SHIELD | Admitting: Sports Medicine

## 2015-04-09 ENCOUNTER — Encounter: Payer: Self-pay | Admitting: Physician Assistant

## 2015-04-09 DIAGNOSIS — Z1231 Encounter for screening mammogram for malignant neoplasm of breast: Secondary | ICD-10-CM | POA: Insufficient documentation

## 2015-05-02 ENCOUNTER — Encounter: Payer: Self-pay | Admitting: Physician Assistant

## 2015-05-10 ENCOUNTER — Ambulatory Visit (INDEPENDENT_AMBULATORY_CARE_PROVIDER_SITE_OTHER): Payer: BLUE CROSS/BLUE SHIELD | Admitting: Physician Assistant

## 2015-05-10 ENCOUNTER — Encounter: Payer: Self-pay | Admitting: Physician Assistant

## 2015-05-10 VITALS — BP 108/67 | HR 62 | Temp 97.9°F | Ht 65.0 in | Wt 158.0 lb

## 2015-05-10 DIAGNOSIS — J029 Acute pharyngitis, unspecified: Secondary | ICD-10-CM | POA: Diagnosis not present

## 2015-05-10 DIAGNOSIS — J0100 Acute maxillary sinusitis, unspecified: Secondary | ICD-10-CM

## 2015-05-10 MED ORDER — AZITHROMYCIN 250 MG PO TABS: ORAL_TABLET | ORAL | Status: DC

## 2015-05-10 NOTE — Patient Instructions (Signed)

## 2015-05-10 NOTE — Progress Notes (Signed)
   Subjective:    Patient ID: Tracy Franklin, female    DOB: Jul 09, 1970, 44 y.o.   MRN: [id number]  HPI Pt is a 44 yo female who presents to the clinic with ST, ear pain, sinus pressure, cough for 6 days. Started with ST. Denies any fever or chills. Symptoms getting progressely worse with more sinus pressure. Tried herbal teas and decongesants with no relief. Sick contacts at work.    Review of Systems  All other systems reviewed and are negative.      Objective:   Physical Exam  Constitutional: She is oriented to person, place, and time. She appears well-developed and well-nourished.  HENT:  Head: Normocephalic and atraumatic.  Right Ear: External ear normal.  Left Ear: External ear normal.  TM's bulging with no blood or pus.  Bilateral maxillary sinus tenderness to palpation.  Oropharynx erythematous without tonsilar swelling or exudate.  Bilateral nasal turbinates red and swollen.   Eyes: Conjunctivae are normal. Right eye exhibits no discharge. Left eye exhibits no discharge.  Neck: Normal range of motion. Neck supple.  Cardiovascular: Normal rate, regular rhythm and normal heart sounds.   Pulmonary/Chest: Effort normal and breath sounds normal. She has no wheezes.  Lymphadenopathy:    She has no cervical adenopathy.  Neurological: She is alert and oriented to person, place, and time.  Skin: Skin is dry.  Psychiatric: She has a normal mood and affect. Her behavior is normal.          Assessment & Plan:  Sinusitis/pharyngitis- treated with zpak. Discussed symptomatic care. Encouraged mucinex and honey for cough.

## 2015-09-30 ENCOUNTER — Encounter: Payer: Self-pay | Admitting: Physician Assistant

## 2015-09-30 ENCOUNTER — Ambulatory Visit (INDEPENDENT_AMBULATORY_CARE_PROVIDER_SITE_OTHER): Payer: 59 | Admitting: Physician Assistant

## 2015-09-30 VITALS — BP 122/65 | HR 60 | Temp 98.0°F | Ht 65.0 in | Wt 155.0 lb

## 2015-09-30 DIAGNOSIS — R300 Dysuria: Secondary | ICD-10-CM | POA: Diagnosis not present

## 2015-09-30 DIAGNOSIS — M545 Low back pain: Secondary | ICD-10-CM | POA: Diagnosis not present

## 2015-09-30 LAB — POCT URINALYSIS DIPSTICK
Bilirubin, UA: NEGATIVE
Blood, UA: NEGATIVE
Glucose, UA: NEGATIVE
Ketones, UA: NEGATIVE
NITRITE UA: NEGATIVE
PROTEIN UA: NEGATIVE
Spec Grav, UA: <=1.005
UROBILINOGEN UA: 0.2
pH, UA: 5.5

## 2015-09-30 MED ORDER — PHENAZOPYRIDINE HCL 200 MG PO TABS: 200.0000 mg | ORAL_TABLET | Freq: Three times a day (TID) | ORAL | Status: DC | PRN

## 2015-09-30 MED ORDER — NITROFURANTOIN MONOHYD MACRO 100 MG PO CAPS: 100.0000 mg | ORAL_CAPSULE | Freq: Two times a day (BID) | ORAL | Status: DC

## 2015-09-30 NOTE — Progress Notes (Signed)
   Subjective:    Patient ID: Tracy Franklin, female    DOB: February 03, 1971, 45 y.o.   MRN: [id number]  HPI  Pt is a 45 yo female who presents to the clinic with dysuria, abdominal tenderness, urinary frequency, and low back pain. Started Tuesday of last week. Her husband is a Education officer, communitydentist and prescribed septra. She has been on for 5 days with some improvement but symptoms are still very present. No fever, chills, nausea. She is urinating every 30 minutes but not a lot of volume. She has had a hysterectomy. Low back sore but no injury. She did take azo at beginning of symptoms.    Review of Systems    see HPI. Objective:   Physical Exam  Constitutional: She is oriented to person, place, and time. She appears well-developed and well-nourished.  HENT:  Head: Normocephalic and atraumatic.  Neck: Normal range of motion. Neck supple.  Cardiovascular: Normal rate, regular rhythm and normal heart sounds.   Pulmonary/Chest: Effort normal and breath sounds normal. She has no wheezes.  No CVA tenderness.   Abdominal: Soft. Bowel sounds are normal.  Diffuse tenderness over lower abdomen to palpation, bilateral.  No guarding or rebound.  Neurological: She is alert and oriented to person, place, and time.  Skin: Skin is dry.  Psychiatric: She has a normal mood and affect.          Assessment & Plan:  Dysuria/low back pain- .. Results for orders placed or performed in visit on 09/30/15  POCT urinalysis dipstick  Result Value Ref Range   Color, UA light yellow    Clarity, UA clear    Glucose, UA neg    Bilirubin, UA neg    Ketones, UA neg    Spec Grav, UA <=1.005    Blood, UA neg    pH, UA 5.5    Protein, UA neg    Urobilinogen, UA 0.2    Nitrite, UA neg    Leukocytes, UA Trace (A) Negative   Stop septra. Could be some resistence. Start macrobid for 7 days. Pyridium for 3 days. Stay hydrated. Will call with culture.

## 2015-10-02 LAB — URINE CULTURE
Colony Count: NO GROWTH
Organism ID, Bacteria: NO GROWTH

## 2016-12-15 ENCOUNTER — Other Ambulatory Visit: Payer: Self-pay

## 2016-12-15 DIAGNOSIS — R928 Other abnormal and inconclusive findings on diagnostic imaging of breast: Secondary | ICD-10-CM

## 2017-01-05 LAB — HM MAMMOGRAPHY

## 2017-01-07 ENCOUNTER — Encounter: Payer: Self-pay | Admitting: Physician Assistant

## 2017-09-30 ENCOUNTER — Ambulatory Visit (INDEPENDENT_AMBULATORY_CARE_PROVIDER_SITE_OTHER): Payer: BC Managed Care – PPO | Admitting: Physician Assistant

## 2017-09-30 ENCOUNTER — Encounter: Payer: Self-pay | Admitting: Physician Assistant

## 2017-09-30 VITALS — BP 115/63 | HR 67 | Ht 65.0 in | Wt 155.0 lb

## 2017-09-30 DIAGNOSIS — M62838 Other muscle spasm: Secondary | ICD-10-CM | POA: Diagnosis not present

## 2017-09-30 DIAGNOSIS — Z23 Encounter for immunization: Secondary | ICD-10-CM

## 2017-09-30 DIAGNOSIS — Z7184 Encounter for health counseling related to travel: Secondary | ICD-10-CM

## 2017-09-30 DIAGNOSIS — N62 Hypertrophy of breast: Secondary | ICD-10-CM | POA: Insufficient documentation

## 2017-09-30 DIAGNOSIS — Z7189 Other specified counseling: Secondary | ICD-10-CM

## 2017-09-30 DIAGNOSIS — M549 Dorsalgia, unspecified: Secondary | ICD-10-CM | POA: Insufficient documentation

## 2017-09-30 DIAGNOSIS — Z0000 Encounter for general adult medical examination without abnormal findings: Secondary | ICD-10-CM

## 2017-09-30 DIAGNOSIS — Z131 Encounter for screening for diabetes mellitus: Secondary | ICD-10-CM | POA: Diagnosis not present

## 2017-09-30 DIAGNOSIS — Z13220 Encounter for screening for lipoid disorders: Secondary | ICD-10-CM

## 2017-09-30 NOTE — Progress Notes (Signed)
Subjective:     Tracy Franklin is a 47 y.o. female and is here for a comprehensive physical exam. The patient reports problems - see below.    Patient c/o today a lot of upper back and neck pain that she feels like it is coming from her large breast. She is a [age] H and has been since she was a teenager. She has shoulder grooves from bra strap. Her breast have always made it hard to exercise. Her upper back and neck strain has been present since 20's but worsened over the last 5 years. She is taking ibuprofen daily for pain. She has gotten massages over the years but only helps temporarily.   Pt is going on a trip to [place] with a school field trip she questions vaccines.   Social History   Socioeconomic History  . Marital status: Unknown    Spouse name: Not on file  . Number of children: Not on file  . Years of education: Not on file  . Highest education level: Not on file  Occupational History  . Not on file  Social Needs  . Financial resource strain: Not on file  . Food insecurity:    Worry: Not on file    Inability: Not on file  . Transportation needs:    Medical: Not on file    Non-medical: Not on file  Tobacco Use  . Smoking status: Never Smoker  . Smokeless tobacco: Never Used  Substance and Sexual Activity  . Alcohol use: Yes  . Drug use: No  . Sexual activity: Yes    Birth control/protection: Surgical  Lifestyle  . Physical activity:    Days per week: Not on file    Minutes per session: Not on file  . Stress: Not on file  Relationships  . Social connections:    Talks on phone: Not on file    Gets together: Not on file    Attends religious service: Not on file    Active member of club or organization: Not on file    Attends meetings of clubs or organizations: Not on file    Relationship status: Not on file  . Intimate partner violence:    Fear of current or ex partner: Not on file    Emotionally abused: Not on file    Physically abused: Not on file    Forced  sexual activity: Not on file  Other Topics Concern  . Not on file  Social History Narrative  . Not on file   Health Maintenance  Topic Date Due  . HIV Screening  10/01/2018 (Originally 03/02/1985)  . MAMMOGRAM  01/05/2018  . INFLUENZA VACCINE  01/20/2018  . TETANUS/TDAP  02/22/2018    The following portions of the patient's history were reviewed and updated as appropriate: allergies, current medications, past family history, past medical history, past social history, past surgical history and problem list.  Review of Systems Pertinent items noted in HPI and remainder of comprehensive ROS otherwise negative.   Objective:    BP 115/63   Pulse 67   Ht 5\' 5"  (1.651 m)   Wt 155 lb (70.3 kg)   BMI 25.79 kg/m  General appearance: alert, cooperative and appears stated age Head: Normocephalic, without obvious abnormality, atraumatic Eyes: conjunctivae/corneas clear. PERRL, EOM's intact. Fundi benign. Ears: normal TM's and external ear canals both ears Nose: Nares normal. Septum midline. Mucosa normal. No drainage or sinus tenderness. Throat: lips, mucosa, and tongue normal; teeth and gums normal Neck: no  adenopathy, no carotid bruit, no JVD, supple, symmetrical, trachea midline and thyroid not enlarged, symmetric, no tenderness/mass/nodules Back: symmetric, no curvature. ROM normal. No CVA tenderness., cervical paraspinous, trapizeus tight and tender. shoulder notching present bilaterally.  Lungs: clear to auscultation bilaterally Heart: regular rate and rhythm, S1, S2 normal, no murmur, click, rub or gallop Abdomen: soft, non-tender; bowel sounds normal; no masses,  no organomegaly Extremities: extremities normal, atraumatic, no cyanosis or edema Pulses: 2+ and symmetric Skin: Skin color, texture, turgor normal. No rashes or lesions Lymph nodes: Cervical, supraclavicular, and axillary nodes normal. Neurologic: Alert and oriented X 3, normal strength and tone. Normal symmetric  reflexes. Normal coordination and gait    Assessment:    Healthy female exam.      Plan:    Marland KitchenMarland KitchenQuetzali was seen today for annual exam.  Diagnoses and all orders for this visit:  Breast hypertrophy in female  Muscle spasm -     Ambulatory referral to Physical Therapy  Screening for lipid disorders -     Lipid Panel w/reflex Direct LDL  Screening for diabetes mellitus -     COMPLETE METABOLIC PANEL WITH GFR  Upper back pain -     Ambulatory referral to Physical Therapy  Need for Tdap vaccination -     Tdap vaccine greater than or equal to 7yo IM  Counseling about travel  .Marland Kitchen Depression screen [facility] 2/9 09/30/2017  Decreased Interest 0  Down, Depressed, Hopeless 0  PHQ - 2 Score 0  Altered sleeping 0  Tired, decreased energy 1  Change in appetite 0  Feeling bad or failure about yourself  0  Trouble concentrating 0  Moving slowly or fidgety/restless 0  Suicidal thoughts 0  PHQ-9 Score 1  Difficult doing work/chores Not difficult at all   .Marland Kitchen Discussed 150 minutes of exercise a week.  Encouraged vitamin D 1000 units and Calcium 1300mg  or 4 servings of dairy a day.  Labs ordered.  Mammogram up to date.   Would like to try some PT. Sample of pennsaid given to rub over problem areas. I think patient would benefit from breast reduction will write a letter. Continue ibuprofen and head. 2 months.   Gave pt printed CDC recommendations for vaccines with travel to [place]. Tdap given today. We could give malaria and typhoid. Yellow fever and japenese encephalitis will have to go to airport. Consider discussing with tour company the recommended vaccines.  See After Visit Summary for Counseling Recommendations

## 2017-09-30 NOTE — Progress Notes (Deleted)
   Subjective:    Patient ID: Tracy Franklin, female    DOB: 05/12/1971, 47 y.o.   MRN: [id number]  HPI  31 H. Grooves in shoulder upper back pain. Neck pain. Wear a bra all the time. Ongoing since yound 20's last 5 years. Can't run 2 bra with exeris posture. Shoulder. Dr. Odis LusterBowers cone. Write a letter beneficial health.   Ibuprofen almost daily to weekly.  Some massages over years not really.  PT 4 weeks     Review of Systems     Objective:   Physical Exam        Assessment & Plan:

## 2017-10-03 ENCOUNTER — Telehealth: Payer: Self-pay | Admitting: Physician Assistant

## 2017-10-03 NOTE — Telephone Encounter (Signed)
Please print communication letter and fax to Dr. Odis LusterBowers office.

## 2017-10-04 NOTE — Telephone Encounter (Signed)
Letter faxed.

## 2017-10-04 NOTE — Telephone Encounter (Signed)
Letter printed, awaiting Physician signature to fax

## 2017-10-22 ENCOUNTER — Ambulatory Visit: Payer: BC Managed Care – PPO | Admitting: Rehabilitative and Restorative Service Providers"

## 2017-10-22 ENCOUNTER — Encounter: Payer: Self-pay | Admitting: Physician Assistant

## 2017-10-22 ENCOUNTER — Ambulatory Visit (INDEPENDENT_AMBULATORY_CARE_PROVIDER_SITE_OTHER): Payer: BC Managed Care – PPO | Admitting: Physician Assistant

## 2017-10-22 ENCOUNTER — Encounter: Payer: Self-pay | Admitting: Rehabilitative and Restorative Service Providers"

## 2017-10-22 VITALS — BP 110/77 | HR 67 | Temp 97.9°F | Wt 155.0 lb

## 2017-10-22 DIAGNOSIS — M542 Cervicalgia: Secondary | ICD-10-CM | POA: Diagnosis not present

## 2017-10-22 DIAGNOSIS — R293 Abnormal posture: Secondary | ICD-10-CM

## 2017-10-22 DIAGNOSIS — J0100 Acute maxillary sinusitis, unspecified: Secondary | ICD-10-CM | POA: Diagnosis not present

## 2017-10-22 DIAGNOSIS — M546 Pain in thoracic spine: Secondary | ICD-10-CM | POA: Diagnosis not present

## 2017-10-22 DIAGNOSIS — R29898 Other symptoms and signs involving the musculoskeletal system: Secondary | ICD-10-CM

## 2017-10-22 MED ORDER — AMOXICILLIN-POT CLAVULANATE 875-125 MG PO TABS: 1.0000 | ORAL_TABLET | Freq: Two times a day (BID) | ORAL | 0 refills | Status: DC

## 2017-10-22 NOTE — Patient Instructions (Signed)
Self massage - myofacial ball release - using ~4 inch plastic ball   Axial Extension (Chin Tuck)    Pull chin in and lengthen back of neck. Hold __5__ seconds while counting out loud. Repeat __10__ times. Do __several__ sessions per day.  Shoulder Blade Squeeze    Rotate shoulders back, then squeeze shoulder blades down and back. Hold 10 sec Repeat _10___ times. Do __several__ sessions per day.  Upper Back Strength: Lower Trapezius / Rotator Cuff " L's "     Arms in waitress pose, palms up. Press hands back and slide shoulder blades down. Hold for __5__ seconds. Repeat _10___ times. 1-2 times per day.    Scapular Retraction: Elbow Flexion (Standing)  "W's"     With elbows bent to 90, pinch shoulder blades together and rotate arms out, keeping elbows bent. Repeat __10__ times per set. Do __1-2__ sets per session. Do _several ___ sessions per day.  Scapula Adduction With Pectoralis Stretch: Low - Standing   Shoulders at 45 hands even with shoulders, keeping weight through legs, shift weight forward until you feel pull or stretch through the front of your chest. Hold _30__ seconds. Do _3__ times, _2-4__ times per day.   Scapula Adduction With Pectoralis Stretch: Mid-Range - Standing   Shoulders at 90 elbows even with shoulders, keeping weight through legs, shift weight forward until you feel pull or strength through the front of your chest. Hold __30_ seconds. Do _3__ times, __2-4_ times per day.   Scapula Adduction With Pectoralis Stretch: High - Standing   Shoulders at 120 hands up high on the doorway, keeping weight on feet, shift weight forward until you feel pull or stretch through the front of your chest. Hold _30__ seconds. Do _3__ times, _2-3__ times per day.  TENS UNIT: This is helpful for muscle pain and spasm.   Search and Purchase a TENS 7000 2nd edition at www.tenspros.com. It should be less than $30.     TENS unit instructions: Do not shower  or bathe with the unit on Turn the unit off before removing electrodes or batteries If the electrodes lose stickiness add a drop of water to the electrodes after they are disconnected from the unit and place on plastic sheet. If you continued to have difficulty, call the TENS unit company to purchase more electrodes. Do not apply lotion on the skin area prior to use. Make sure the skin is clean and dry as this will help prolong the life of the electrodes. After use, always check skin for unusual red areas, rash or other skin difficulties. If there are any skin problems, does not apply electrodes to the same area. Never remove the electrodes from the unit by pulling the wires. Do not use the TENS unit or electrodes other than as directed. Do not change electrode placement without consultating your therapist or physician. Keep 2 fingers with between each electrode.   Trigger Point Dry Needling  . What is Trigger Point Dry Needling (DN)? o DN is a physical therapy technique used to treat muscle pain and dysfunction. Specifically, DN helps deactivate muscle trigger points (muscle knots).  o A thin filiform needle is used to penetrate the skin and stimulate the underlying trigger point. The goal is for a local twitch response (LTR) to occur and for the trigger point to relax. No medication of any kind is injected during the procedure.   . What Does Trigger Point Dry Needling Feel Like?  o The procedure feels different for each  individual patient. Some patients report that they do not actually feel the needle enter the skin and overall the process is not painful. Very mild bleeding may occur. However, many patients feel a deep cramping in the muscle in which the needle was inserted. This is the local twitch response.   Marland Kitchen How Will I feel after the treatment? o Soreness is normal, and the onset of soreness may not occur for a few hours. Typically this soreness does not last longer than two days.   o Bruising is uncommon, however; ice can be used to decrease any possible bruising.  o In rare cases feeling tired or nauseous after the treatment is normal. In addition, your symptoms may get worse before they get better, this period will typically not last longer than 24 hours.   . What Can I do After My Treatment? o Increase your hydration by drinking more water for the next 24 hours. o You may place ice or heat on the areas treated that have become sore, however, do not use heat on inflamed or bruised areas. Heat often brings more relief post needling. o You can continue your regular activities, but vigorous activity is not recommended initially after the treatment for 24 hours. o DN is best combined with other physical therapy such as strengthening, stretching, and other therapies.      [facility] [address] [place], [place] [address]  [phone] (office) [phone] (fax)

## 2017-10-22 NOTE — Therapy (Signed)
[facility] [place] [address] [place], [place], [address] Phone: [phone]   Fax:  [phone]  Physical Therapy Evaluation  Patient Details  Name: Tracy Franklin MRN: [id number] Date of Birth: Aug 17, 1970 Referring Provider: Tandy Gaw, PA-C   Encounter Date: 10/22/2017  PT End of Session - 10/22/17 0920    Visit Number  1    Number of Visits  12    Date for PT Re-Evaluation  12/03/17    PT Start Time  0845    PT Stop Time  0944    PT Time Calculation (min)  59 min    Activity Tolerance  Patient tolerated treatment well       History reviewed. No pertinent past medical history.  Past Surgical History:  Procedure Laterality Date  . ABDOMINAL HYSTERECTOMY      There were no vitals filed for this visit.   Subjective Assessment - 10/22/17 0852    Subjective  Patient reports that she has had discomfort and limitations over the past several years. She has noticed incresaed pain and stiffness in the past 2 years with symptoms gradually increasing.     Pertinent History  hysterectomy; Lt hip bursitis due to fall several years ago     Patient Stated Goals  get rid of the pain and learn exercises for home     Currently in Pain?  Yes    Pain Score  5     Pain Location  Neck    Pain Orientation  Right;Left    Pain Descriptors / Indicators  Tightness;Aching stiffness - at times sharp pain     Pain Type  Chronic pain    Pain Radiating Towards  into the shoulder blade area; one spot center left     Pain Onset  More than a month ago    Pain Frequency  Constant    Aggravating Factors   life    Pain Relieving Factors  motrin; heat         [facility] PT Assessment - 10/22/17 0001      Assessment   Medical Diagnosis  Cervical and thoracic dysfunction    Referring Provider  Tandy Gaw, PA-C    Onset Date/Surgical Date  11/21/15 symptoms have been present for 20 years     Hand Dominance  Left    Next MD Visit  to return  after PT x 4 weeks     Prior Therapy  none for this condition       Precautions   Precautions  None      Balance Screen   Has the patient fallen in the past 6 months  No    Has the patient had a decrease in activity level because of a fear of falling?   No    Is the patient reluctant to leave their home because of a fear of falling?   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    Leisure  household chores; volunteers at church; indoor cycle - 2-3 times/wk for 30-45 min       Observation/Other Assessments   Focus on Therapeutic Outcomes (FOTO)   50% limitation       Sensation   Additional Comments  WFL's  Posture/Postural Control   Posture Comments  head forward; shoudlers rounded and elevated; head of the humerus anterior in orientation      AROM   Cervical Flexion  43 tight    Cervical Extension  38 tight; sore    Cervical - Right Side Bend  32 tight    Cervical - Left Side Bend  24 tight; sore    Cervical - Right Rotation  71 tight    Cervical - Left Rotation  65 tight; discomfort      Strength   Overall Strength Comments  WLF's except decresed postureal strength middle and lower traps       Palpation   Spinal mobility  hypomobile cervical and thoracic CPA mobs and Lt > Rt lateral mobs     Palpation comment  tight Lt > Rt pecs; upper trap; leveator; cervical and thoracic paraspinals; teres                 Objective measurements completed on examination: See above findings.      [facility] Adult PT Treatment/Exercise - 10/22/17 0001      Neuro Re-ed    Neuro Re-ed Details   postural correction       Shoulder Exercises: Standing   Other Standing Exercises  axial extension 10 sec x 5; scap squeeze 10 sec x 10; L's x 5 - with swim noodle along spine       Shoulder  Exercises: Stretch   Other Shoulder Stretches  3 way doorway stretch 30 sec x 2 each position       Moist Heat Therapy   Number Minutes Moist Heat  20 Minutes    Moist Heat Location  Cervical thoracic       Electrical Stimulation   Electrical Stimulation Location  bilat lateral cervical bilat upper trap     Electrical Stimulation Action  IFC    Electrical Stimulation Parameters  to tolerance    Electrical Stimulation Goals  Pain;Tone             PT Education - 10/22/17 0920    Education provided  Yes    Education Details  HEP TENS DN    Person(s) Educated  Patient    Methods  Explanation;Demonstration;Tactile cues;Verbal cues;Handout    Comprehension  Verbalized understanding;Returned demonstration;Verbal cues required;Tactile cues required          PT Long Term Goals - 10/22/17 1302      PT LONG TERM GOAL #1   Title  Improve posture and alignment with patient to demonstrate improved upright posture with posterior shoulder girdle musculature activated 12/03/17    Time  6    Period  Weeks    Status  New      PT LONG TERM GOAL #2   Title  Decrease frequency, intensity and duration of cervical and thoracic pain by 50-70% allowing patient to perform normal functional activities with less pain and discomfort 12/03/17    Time  6    Period  Weeks    Status  New      PT LONG TERM GOAL #3   Title  Increase cervical ROM and mobiltiy by 5-8 degrees 12/03/17    Time  6    Period  Weeks    Status  New      PT LONG TERM GOAL #4   Title  Independent in HEP 12/03/17    Time  6    Period  Weeks    Status  New  PT LONG TERM GOAL #5   Title  Improve FOTO to </= 38% limitation 12/03/17    Time  6    Period  Weeks    Status  New             Plan - 10/22/17 1255    Clinical Impression Statement  Tracy Franklin presents with chronic cervical and thoracic dysfunction including poor posture and alignment; limited cervical and thoracic mobility and ROM; muscular tightness to  palpation; pain on a constant basis. She will benefit from PT to address problems identified.     History and Personal Factors relevant to plan of care:  chronic nature of cervical and thoracic problems     Clinical Presentation  Stable    Clinical Decision Making  Low    Rehab Potential  Good    Clinical Impairments Affecting Rehab Potential  forward head posture with work, home and exercise; large breast size    PT Frequency  2x / week    PT Duration  6 weeks    PT Treatment/Interventions  Patient/family education;ADLs/Self Care Home Management;Cryotherapy;Electrical Stimulation;Iontophoresis /ml Dexamethasone;Moist Heat;Ultrasound;Dry needling;Manual techniques;Neuromuscular re-education;Therapeutic activities;Therapeutic exercise    PT Next Visit Plan  continue postural correction; review HEP; DN/manual therapy; progress exercise as indicated; modalities as appropriate     Consulted and Agree with Plan of Care  Patient       Patient will benefit from skilled therapeutic intervention in order to improve the following deficits and impairments:  Postural dysfunction, Improper body mechanics, Pain, Increased fascial restricitons, Increased muscle spasms, Decreased mobility, Decreased range of motion, Decreased activity tolerance, Decreased strength  Visit Diagnosis: Cervicalgia - Plan: PT plan of care cert/re-cert  Pain in thoracic spine - Plan: PT plan of care cert/re-cert  Other symptoms and signs involving the musculoskeletal system - Plan: PT plan of care cert/re-cert  Abnormal posture - Plan: PT plan of care cert/re-cert     Problem List Patient Active Problem List   Diagnosis Date Noted  . Breast hypertrophy in female 09/30/2017  . Muscle spasm 09/30/2017  . Upper back pain 09/30/2017  . Encounter for screening mammogram for malignant neoplasm of breast 04/09/2015  . Retrocalcaneal bursitis 11/02/2014  . Facet arthritis of lumbar region 11/02/2014  . Menopausal symptoms  05/07/2014  . SI (sacroiliac) joint dysfunction 03/26/2014    Tahjai Schetter Rober Minion PT, MPH  10/22/2017, 1:39 PM  [facility] [address] [place], [place], [address] Phone: [phone]   Fax:  [phone]  Name: Tracy Franklin MRN: [id number] Date of Birth: [birth year]

## 2017-10-22 NOTE — Progress Notes (Signed)
HPI:                                                                Tracy Franklin is a 47 y.o. female who presents to [facility] today for sinus congestion and cough  Sinusitis  This is a new problem. The current episode started 1 to 4 weeks ago. The problem is unchanged. There has been no fever. The pain is mild. Associated symptoms include chills, congestion, coughing, diaphoresis and sinus pressure. Pertinent negatives include no ear pain, neck pain, shortness of breath or sore throat. Past treatments include antibiotics (OTC cold meds). The treatment provided mild relief.    No past medical history on file. Past Surgical History:  Procedure Laterality Date  . ABDOMINAL HYSTERECTOMY     Social History   Tobacco Use  . Smoking status: Never Smoker  . Smokeless tobacco: Never Used  Substance Use Topics  . Alcohol use: Yes   family history includes Cancer in her father; Hyperlipidemia in her father.    ROS: negative except as noted in the HPI  Medications: Current Outpatient Medications  Medication Sig Dispense Refill  . Calcium-Magnesium 250-125 MG TABS Take by mouth.    . Cholecalciferol (VITAMIN D) 2000 UNITS CAPS Take 5,000 Units by mouth.    . Multiple Vitamins-Minerals (MULTIVITAMIN WITH MINERALS) tablet Take 1 tablet by mouth daily.     No current facility-administered medications for this visit.    No Known Allergies     Objective:  BP 110/77   Pulse 67   Temp 97.9 F (36.6 C) (Oral)   Wt 155 lb (70.3 kg)   SpO2 99%   BMI 25.79 kg/m  Gen:  alert, ill-appearing, not toxic-appearing, no distress, appropriate for age HEENT: head normocephalic without obvious abnormality, conjunctiva and cornea clear, allergic shiners bilaterally, nasal mucosa edematous, there is maxillary sinus tenderness, oropharynx clear, neck supple, no cervical adenopathy, trachea midline Pulm: Normal work of breathing, normal phonation,  clear to auscultation bilaterally, no wheezes, rales or rhonchi CV: Normal rate, regular rhythm, s1 and s2 distinct, no murmurs, clicks or rubs  Neuro: alert and oriented x 3, no tremor MSK: extremities atraumatic, normal gait and station Skin: intact, no rashes on exposed skin, no jaundice, no cyanosis   No results found for this or any previous visit (from the past 72 hour(s)). No results found.    Assessment and Plan: 47 y.o. female with   Acute non-recurrent maxillary sinusitis - Plan: amoxicillin-clavulanate (AUGMENTIN) 875-125 MG tablet - counseled on symptomatic care, nasal saline rinses, warm facial compresses, intranasal steroid QHS  Patient education and anticipatory guidance given Patient agrees with treatment plan Follow-up as needed if symptoms worsen or fail to improve  Levonne Hubert PA-C

## 2017-10-22 NOTE — Patient Instructions (Signed)

## 2017-10-29 ENCOUNTER — Encounter: Payer: Self-pay | Admitting: Physician Assistant

## 2017-10-29 ENCOUNTER — Ambulatory Visit (INDEPENDENT_AMBULATORY_CARE_PROVIDER_SITE_OTHER): Payer: BC Managed Care – PPO | Admitting: Physical Therapy

## 2017-10-29 DIAGNOSIS — R293 Abnormal posture: Secondary | ICD-10-CM

## 2017-10-29 DIAGNOSIS — M542 Cervicalgia: Secondary | ICD-10-CM | POA: Diagnosis not present

## 2017-10-29 DIAGNOSIS — R29898 Other symptoms and signs involving the musculoskeletal system: Secondary | ICD-10-CM | POA: Diagnosis not present

## 2017-10-29 DIAGNOSIS — M546 Pain in thoracic spine: Secondary | ICD-10-CM

## 2017-10-29 NOTE — Therapy (Signed)
[facility] [place] [address] [place], [place], [address] Phone: [phone]   Fax:  [phone]  Physical Therapy Treatment  Patient Details  Name: Tracy Franklin MRN: [id number] Date of Birth: 07/30/1970 Referring Provider: Tandy Gaw, PA-C   Encounter Date: 10/29/2017  PT End of Session - 10/29/17 0852    Visit Number  2    Number of Visits  12    Date for PT Re-Evaluation  12/03/17    PT Start Time  0849    PT Stop Time  0943    PT Time Calculation (min)  54 min       No past medical history on file.  Past Surgical History:  Procedure Laterality Date  . ABDOMINAL HYSTERECTOMY      There were no vitals filed for this visit.  Subjective Assessment - 10/29/17 0852    Subjective  Pt reports she was very sore for days after last visit.  She has been onsistently doing doorway stretch, and trying to complete others.  She reports her symptoms are about the same, except she is much more aware of posture.     Patient Stated Goals  get rid of the pain and learn exercises for home     Currently in Pain?  Yes    Pain Score  5     Pain Location  Neck    Pain Orientation  Right;Left    Pain Descriptors / Indicators  Aching;Tightness    Aggravating Factors   weight of chest    Pain Relieving Factors  motrin, heat,  hot shower.          [facility] PT Assessment - 10/29/17 0001      Assessment   Medical Diagnosis  Cervical and thoracic dysfunction    Referring Provider  Tandy Gaw, PA-C    Onset Date/Surgical Date  11/21/15 symptoms have been present for 20 years     Hand Dominance  Left        [facility] Adult PT Treatment/Exercise - 10/29/17 0001      Self-Care   Self-Care  Other Self-Care Comments    Other Self-Care Comments   Pt educated on parameters, safety, and application. Pt verbalized understanding.       Neck Exercises: Machines for Strengthening   UBE (Upper Arm Bike)  L1: 1 min forward, 1 min backward.       Neck Exercises: Standing   Neck Retraction  1 rep ck for form for HEP      Neck Exercises: Seated   Other Seated Exercise  thoracic ext with hands supporting head, over back of chair x 2 reps.       Neck Exercises: Supine   Other Supine Exercise  thoracic ext over black  bolster x 2 reps    Other Supine Exercise  scap squeezes/thoracic lifts x 5 sec x 10 reps      Shoulder Exercises: Standing   Other Standing Exercises  Pool noodle against back:  L's and W's x 5 sec x 10 reps      Shoulder Exercises: Stretch   Other Shoulder Stretches  3 way doorway stretch 30 sec x 2 each position  3rd rep unilateral mid and high level for improved comfort.       Moist Heat Therapy   Number Minutes Moist Heat  15 Minutes    Moist Heat Location  Cervical thoracic       Electrical Stimulation   Electrical Stimulation Location  bilat  lateral cervical    Electrical Stimulation Action  TENS    Electrical Stimulation Parameters  to tolerance    Electrical Stimulation Goals  Pain;Tone      Manual Therapy   Manual Therapy  Soft tissue mobilization;Myofascial release    Manual therapy comments  pt supine    Soft tissue mobilization  STM to bilat cervical paraspinals, upper trap, Rt levator, mid traps    Myofascial Release  suboccipital release      Neck Exercises: Stretches   Upper Trapezius Stretch  Right;Left;2 reps discomfort in Lt neck with Lt side bend.     Levator Stretch  Right;Left;2 reps;20 seconds    Other Neck Stretches  scalenes/SCM stretch x 15 sec each side.                   PT Long Term Goals - 10/29/17 0900      PT LONG TERM GOAL #1   Title  Improve posture and alignment with patient to demonstrate improved upright posture with posterior shoulder girdle musculature activated 12/03/17    Time  6    Period  Weeks    Status  On-going      PT LONG TERM GOAL #2   Title  Decrease frequency, intensity and duration of cervical and thoracic pain by 50-70% allowing patient  to perform normal functional activities with less pain and discomfort 12/03/17    Time  6    Period  Weeks    Status  On-going      PT LONG TERM GOAL #3   Title  Increase cervical ROM and mobiltiy by 5-8 degrees 12/03/17    Time  6    Period  Weeks    Status  On-going      PT LONG TERM GOAL #4   Title  Independent in HEP 12/03/17    Time  6    Period  Weeks    Status  On-going      PT LONG TERM GOAL #5   Title  Improve FOTO to </= 38% limitation 12/03/17    Time  6    Period  Weeks    Status  On-going            Plan - 10/29/17 1009    Clinical Impression Statement  Pt has had little change in symptoms since last visit, but verbalized increased awareness of posture. Encouraged pt to change sleep position (currently sleeping on stomach with head turned Lt and Rt forearm at forehead) and to continue stretches.  She tolerated all exercises well without increase in symptoms.  Pt has upcoming international trip in beginning of June; voiced interest in trying DN the week before trip.      Rehab Potential  Good    PT Frequency  2x / week    PT Duration  6 weeks    PT Treatment/Interventions  Patient/family education;ADLs/Self Care Home Management;Cryotherapy;Electrical Stimulation;Iontophoresis /ml Dexamethasone;Moist Heat;Ultrasound;Dry needling;Manual techniques;Neuromuscular re-education;Therapeutic activities;Therapeutic exercise    PT Next Visit Plan  Manual work to neck/ thoracic musculature and pecs; progress postural strengthening.  Continue back education. Issue sample of biofreeze.   Consulted and Agree with Plan of Care  Patient       Patient will benefit from skilled therapeutic intervention in order to improve the following deficits and impairments:  Postural dysfunction, Improper body mechanics, Pain, Increased fascial restricitons, Increased muscle spasms, Decreased mobility, Decreased range of motion, Decreased activity tolerance, Decreased strength  Visit  Diagnosis: Cervicalgia  Pain in thoracic spine  Other symptoms and signs involving the musculoskeletal system  Abnormal posture     Problem List Patient Active Problem List   Diagnosis Date Noted  . Breast hypertrophy in female 09/30/2017  . Muscle spasm 09/30/2017  . Upper back pain 09/30/2017  . Encounter for screening mammogram for malignant neoplasm of breast 04/09/2015  . Retrocalcaneal bursitis 11/02/2014  . Facet arthritis of lumbar region 11/02/2014  . Menopausal symptoms 05/07/2014  . SI (sacroiliac) joint dysfunction 03/26/2014   Mayer Camel, PTA 10/29/17 11:19 AM  [facility] [place] [address] [place], [place], [address] Phone: [phone]   Fax:  [phone]  Name: Tracy Franklin MRN: [id number] Date of Birth: [birth year]

## 2017-11-01 ENCOUNTER — Encounter: Payer: Self-pay | Admitting: Rehabilitative and Restorative Service Providers"

## 2017-11-01 ENCOUNTER — Other Ambulatory Visit: Payer: Self-pay | Admitting: Physician Assistant

## 2017-11-01 ENCOUNTER — Ambulatory Visit: Payer: BC Managed Care – PPO | Admitting: Rehabilitative and Restorative Service Providers"

## 2017-11-01 DIAGNOSIS — M542 Cervicalgia: Secondary | ICD-10-CM | POA: Diagnosis not present

## 2017-11-01 DIAGNOSIS — R293 Abnormal posture: Secondary | ICD-10-CM

## 2017-11-01 DIAGNOSIS — R29898 Other symptoms and signs involving the musculoskeletal system: Secondary | ICD-10-CM

## 2017-11-01 DIAGNOSIS — M546 Pain in thoracic spine: Secondary | ICD-10-CM | POA: Diagnosis not present

## 2017-11-01 MED ORDER — TYPHOID VACCINE PO CPDR: 1.0000 | DELAYED_RELEASE_CAPSULE | ORAL | 0 refills | Status: DC

## 2017-11-01 MED ORDER — PRIMAQUINE PHOSPHATE 26.3 MG PO TABS: 30.0000 mg | ORAL_TABLET | Freq: Every day | ORAL | 0 refills | Status: DC

## 2017-11-01 NOTE — Therapy (Addendum)
[place], [place], [address] Phone: [phone]   Fax:  [phone]  Physical Therapy Treatment  Patient Details  Name: Tracy Franklin MRN: [id number] Date of Birth: February 25, 1971 Referring Provider: Iran Planas, PA-C   Encounter Date: 11/01/2017  PT End of Session - 11/01/17 0714    Visit Number  3    Number of Visits  12    Date for PT Re-Evaluation  12/03/17    PT Start Time  0714    PT Stop Time  0818    PT Time Calculation (min)  64 min    Activity Tolerance  Patient tolerated treatment well       History reviewed. No pertinent past medical history.  Past Surgical History:  Procedure Laterality Date  . ABDOMINAL HYSTERECTOMY      There were no vitals filed for this visit.  Subjective Assessment - 11/01/17 0722    Subjective  Patient reports tht she is tight feeling this am.     Currently in Pain?  Yes    Pain Score  6     Pain Location  Neck    Pain Orientation  Right;Left    Pain Descriptors / Indicators  Aching;Tightness    Pain Type  Chronic pain    Pain Onset  More than a month ago    Pain Frequency  Constant                       [facility] Adult PT Treatment/Exercise - 11/01/17 0001      Self-Care   Other Self-Care Comments   discussion on sleep positions to avoid sleeping prone       Neck Exercises: Theraband   Scapula Retraction  10 reps yellow TB - some discomfort     Rows  10 reps;Red some discomfort mid thoracic area       Neck Exercises: Standing   Neck Retraction  5 reps      Neck Exercises: Supine   Neck Retraction  5 reps chin tuck 5 sec       Shoulder Exercises: Standing   Other Standing Exercises  Pool noodle against back:  L's and W's x 5 sec x 10 reps      Shoulder Exercises: Stretch   Other Shoulder Stretches  3 way doorway stretch 30 sec x 2 each position  some dsiscomfort mid thoracic       Moist Heat Therapy   Number Minutes Moist  Heat  20 Minutes    Moist Heat Location  Cervical thoracic       Electrical Stimulation   Electrical Stimulation Location  bilat lateral cervical; bilat thoracic     Electrical Stimulation Action  TENs     Electrical Stimulation Parameters  to tolerance    Electrical Stimulation Goals  Pain;Tone      Manual Therapy   Manual Therapy  Soft tissue mobilization;Myofascial release    Manual therapy comments  pt prone and supine     Joint Mobilization  thoracic mobs CPA and lateral to tolerance Grade II    Soft tissue mobilization  STM to bilat cervical paraspinals, upper trap, Rt levator, mid traps    Myofascial Release  suboccipital release                  PT Long Term Goals - 11/01/17 0716      PT LONG TERM GOAL #1   Title  Improve posture  and alignment with patient to demonstrate improved upright posture with posterior shoulder girdle musculature activated 12/03/17    Time  6    Period  Weeks    Status  On-going      PT LONG TERM GOAL #2   Title  Decrease frequency, intensity and duration of cervical and thoracic pain by 50-70% allowing patient to perform normal functional activities with less pain and discomfort 12/03/17    Time  6    Period  Weeks    Status  On-going      PT LONG TERM GOAL #3   Title  Increase cervical ROM and mobiltiy by 5-8 degrees 12/03/17    Time  6    Period  Weeks    Status  On-going      PT LONG TERM GOAL #4   Title  Independent in HEP 12/03/17    Time  6    Period  Weeks    Status  On-going      PT LONG TERM GOAL #5   Title  Improve FOTO to </= 38% limitation 12/03/17    Time  6    Period  Weeks    Status  On-going            Plan - 11/01/17 7169    Clinical Impression Statement  No significant change in symptoms with therapy and home program. Patient reports that she continues to have a lot of tightness through neck and upper back. Trial of TB exercises with report of increased discomfort and pain in the thoracic spine area -  held for HEP. Wants to wait until the last week of May for DN so she will be on vacation to recover from any soreness related to the DN.     Rehab Potential  Good    Clinical Impairments Affecting Rehab Potential  forward head posture with work, home and exercise; large breast size    PT Frequency  2x / week    PT Duration  6 weeks    PT Treatment/Interventions  Patient/family education;ADLs/Self Care Home Management;Cryotherapy;Electrical Stimulation;Iontophoresis 67m/ml Dexamethasone;Moist Heat;Ultrasound;Dry needling;Manual techniques;Neuromuscular re-education;Therapeutic activities;Therapeutic exercise    PT Next Visit Plan  assess response to manual work to neck/ thoracic musculature and pecs; progress postural strengthening. DN week of May 27th.     Consulted and Agree with Plan of Care  Patient       Patient will benefit from skilled therapeutic intervention in order to improve the following deficits and impairments:  Postural dysfunction, Improper body mechanics, Pain, Increased fascial restricitons, Increased muscle spasms, Decreased mobility, Decreased range of motion, Decreased activity tolerance, Decreased strength  Visit Diagnosis: Cervicalgia  Pain in thoracic spine  Other symptoms and signs involving the musculoskeletal system  Abnormal posture     Problem List Patient Active Problem List   Diagnosis Date Noted  . Breast hypertrophy in female 09/30/2017  . Muscle spasm 09/30/2017  . Upper back pain 09/30/2017  . Encounter for screening mammogram for malignant neoplasm of breast 04/09/2015  . Retrocalcaneal bursitis 11/02/2014  . Facet arthritis of lumbar region 11/02/2014  . Menopausal symptoms 05/07/2014  . SI (sacroiliac) joint dysfunction 03/26/2014    Tracy Franklin PNilda SimmerPT, MPH  11/01/2017, 12:57 PM  [facility] [address]Phone: [phone]  Fax:  [phone] Name:  Tracy NooriMRN: [id number] of Birth: [birth year] PHYSICAL THERAPY DISCHARGE SUMMARY  Visits from Start of Care:  3  Current functional level related to goals / functional outcomes: See last progress note for discharge status   Remaining deficits: Unknown    Education / Equipment: HEP  Plan: Patient agrees to discharge.  Patient goals were partially met. Patient is being discharged due to not returning since the last visit.  ?????     Tracy Franklin P. Helene Kelp PT, MPH 12/01/17 10:11 AM

## 2017-11-05 ENCOUNTER — Encounter: Payer: BC Managed Care – PPO | Admitting: Physical Therapy

## 2017-11-09 ENCOUNTER — Telehealth: Payer: Self-pay | Admitting: *Deleted

## 2017-11-09 NOTE — Telephone Encounter (Signed)
Malaria medication is unavailable until sometime in July. 

## 2017-11-09 NOTE — Telephone Encounter (Signed)
Can we call pharmacy and see what they have in stock to cover malaria that is also affordable?

## 2017-11-10 MED ORDER — ATOVAQUONE-PROGUANIL HCL 250-100 MG PO TABS: 1.0000 | ORAL_TABLET | Freq: Every day | ORAL | 0 refills | Status: DC

## 2017-11-10 NOTE — Telephone Encounter (Signed)
Spoke to Mucarabones at [facility], he states that the medication they can get is "atovaquone proguanil".  He states this is the medication they most often see for Malaria and it is sometimes covered by insurance- he was not able to give me a price over the phone.

## 2017-11-10 NOTE — Telephone Encounter (Signed)
Sent!

## 2017-11-10 NOTE — Telephone Encounter (Signed)
Pt advised. Will contact office if she is unable to afford or if any other issues arise.

## 2017-11-12 ENCOUNTER — Encounter: Payer: BC Managed Care – PPO | Admitting: Physical Therapy

## 2017-11-19 ENCOUNTER — Encounter: Payer: BC Managed Care – PPO | Admitting: Rehabilitative and Restorative Service Providers"

## 2017-12-07 LAB — LIPID PANEL W/REFLEX DIRECT LDL
CHOL/HDL RATIO: 2.7 (calc) (ref <5.0)
CHOLESTEROL: 216 mg/dL — AB (ref <200)
HDL: 80 mg/dL (ref >50)
LDL CHOLESTEROL (CALC): 120 mg/dL — AB
Non-HDL Cholesterol (Calc): 136 mg/dL (calc) — ABNORMAL HIGH (ref <130)
Triglycerides: 68 mg/dL (ref <150)

## 2017-12-07 LAB — COMPLETE METABOLIC PANEL WITH GFR
AG Ratio: 1.7 (calc) (ref 1.0–2.5)
ALBUMIN MSPROF: 4.7 g/dL (ref 3.6–5.1)
ALKALINE PHOSPHATASE (APISO): 79 U/L (ref 33–115)
ALT: 17 U/L (ref 6–29)
AST: 18 U/L (ref 10–35)
BUN: 13 mg/dL (ref 7–25)
CO2: 28 mmol/L (ref 20–32)
CREATININE: 0.84 mg/dL (ref 0.50–1.10)
Calcium: 9.8 mg/dL (ref 8.6–10.2)
Chloride: 103 mmol/L (ref 98–110)
GFR, EST NON AFRICAN AMERICAN: 83 mL/min/{1.73_m2} (ref >=60)
GFR, Est African American: 96 mL/min/{1.73_m2} (ref >=60)
GLOBULIN: 2.7 g/dL (ref 1.9–3.7)
Glucose, Bld: 94 mg/dL (ref 65–99)
Potassium: 4.4 mmol/L (ref 3.5–5.3)
SODIUM: 139 mmol/L (ref 135–146)
Total Bilirubin: 0.5 mg/dL (ref 0.2–1.2)
Total Protein: 7.4 g/dL (ref 6.1–8.1)

## 2017-12-08 NOTE — Progress Notes (Signed)
Call pt: HDL is great!!!! LdL is up a little but still looks good. Goal for your age and risk is under 130.  Kidney, liver, glucose look great.

## 2017-12-27 ENCOUNTER — Encounter: Payer: Self-pay | Admitting: Physician Assistant

## 2017-12-28 ENCOUNTER — Encounter: Payer: Self-pay | Admitting: Physician Assistant

## 2017-12-30 ENCOUNTER — Encounter: Payer: Self-pay | Admitting: Rehabilitative and Restorative Service Providers"

## 2017-12-30 ENCOUNTER — Ambulatory Visit: Payer: BC Managed Care – PPO | Admitting: Rehabilitative and Restorative Service Providers"

## 2017-12-30 DIAGNOSIS — M542 Cervicalgia: Secondary | ICD-10-CM | POA: Diagnosis not present

## 2017-12-30 DIAGNOSIS — R293 Abnormal posture: Secondary | ICD-10-CM | POA: Diagnosis not present

## 2017-12-30 DIAGNOSIS — M546 Pain in thoracic spine: Secondary | ICD-10-CM | POA: Diagnosis not present

## 2017-12-30 DIAGNOSIS — R29898 Other symptoms and signs involving the musculoskeletal system: Secondary | ICD-10-CM

## 2017-12-30 NOTE — Therapy (Addendum)
[place], [place], [address] Phone: [phone]   Fax:  [phone]  Physical Therapy Treatment  Patient Details  Name: Tracy Franklin MRN: [id number] Date of Birth: May 28, 1971 Referring Provider: Iran Planas, [facility]   Encounter Date: 12/30/2017  PT End of Session - 12/30/17 1451    Visit Number  4    Number of Visits  12    Date for PT Re-Evaluation  02/10/18    PT Start Time  7116    PT Stop Time  1545    PT Time Calculation (min)  58 min    Activity Tolerance  Patient tolerated treatment well       History reviewed. No pertinent past medical history.  Past Surgical History:  Procedure Laterality Date  . ABDOMINAL HYSTERECTOMY      There were no vitals filed for this visit.  Subjective Assessment - 12/30/17 1453    Subjective  Patient reports that she was sore and in pain for several days following the last PT treatment - related to the soft tissue work. She continues to have cervical pain and discomfort bilaterally, always worse on the Lt with tingling into the Lt little finger. She has headaches at least twice a week. Headaches will start in the Lt neck area.     Pertinent History  denies medical problems - She hsa a history of chronic neck pain for the past 5 or more years which has progressively worsened over the past two years     Diagnostic tests  xrays     Patient Stated Goals  decreased pain and numbness in the Lt arm, decrease headaches; improve posture     Currently in Pain?  Yes    Pain Score  5     Pain Location  Neck    Pain Orientation  Left;Right    Pain Descriptors / Indicators  Aching;Tightness pinching     Pain Type  Chronic pain    Pain Radiating Towards  Lt UE to little finger - numbness     Pain Onset  More than a month ago    Pain Frequency  Constant    Aggravating Factors   sitting for longer periods at time; driving; desk or computer work; life    Pain Relieving  Factors  motrin; heat         [facility] PT Assessment - 12/30/17 0001      Assessment   Medical Diagnosis  Cervicalgia    Referring Provider  Iran Planas, [facility]    Onset Date/Surgical Date  11/21/15    Hand Dominance  Left    Next MD Visit  to schedule     Prior Therapy  no       Precautions   Precautions  None      Balance Screen   Has the patient fallen in the past 6 months  No    Has the patient had a decrease in activity level because of a fear of falling?   No    Is the patient reluctant to leave their home because of a fear of falling?   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     Leisure  household chores; walking; palites; yoga; indoor cycling; light [employer]       Posture/Postural Control  Posture Comments  head forward; shoulders elevated and rounded; head of the humerus anterior in orientation; scapulae abducted and rotated along the thoracic wall       AROM   Right/Left Shoulder  -- tight end ranges elevatioin bilat -pain upper thoracic spine    Cervical Flexion  49    Cervical Extension  33 pain in the upper back     Cervical - Right Side Bend  38 stretch Lt cervical     Cervical - Left Side Bend  38 pain Lt lateral cervical     Cervical - Right Rotation  57 tightness on Lt     Cervical - Left Rotation  59 tightness on Lt      Strength   Overall Strength Comments  5/5 bilat UE's       Palpation   Spinal mobility  hypomobile mid thoracic ; lower cervical; upper cervical with CPA and lateral mobs     Palpation comment  significant muscular tightness through the pecs; upper trap; leveator; ant/lat/post cervical musculature Lt > Rt                    [facility] Adult PT Treatment/Exercise - 12/30/17 0001      Neck Exercises: Theraband   Scapula Retraction  10 reps yellow TB - some discomfort     Rows  10 reps;Red  some discomfort mid thoracic area       Neck Exercises: Standing   Neck Retraction  5 reps      Shoulder Exercises: Standing   Extension  Strengthening;Right;Left;10 reps;Theraband    Theraband Level (Shoulder Extension)  Level 2 (Red)    Row  Strengthening;Right;Left;10 reps;Theraband    Theraband Level (Shoulder Row)  Level 2 (Red)    Retraction  Strengthening;Right;Left;10 reps;Theraband    Theraband Level (Shoulder Retraction)  Level 2 (Red)    Other Standing Exercises  axial extension 10 sec x 5; scap squeeze 10 sec x 10;  L's and W's x 5 sec x 10 reps with noodle       Shoulder Exercises: Stretch   Other Shoulder Stretches  3 way doorway stretch 30 sec x 2 each position - trial of corner stretch to address mid back pain corner position for pec stretch was more comfortable for mid back  some dsiscomfort mid thoracic       Moist Heat Therapy   Number Minutes Moist Heat  20 Minutes    Moist Heat Location  Cervical thoracic       Electrical Stimulation   Electrical Stimulation Location  bilat lateral cervical; bilat thoracic     Electrical Stimulation Action  TENS     Electrical Stimulation Parameters  to tolerance    Electrical Stimulation Goals  Pain;Tone             PT Education - 12/30/17 1759    Education Details  HEP     Person(s) Educated  Patient    Methods  Explanation;Demonstration;Tactile cues;Verbal cues;Handout    Comprehension  Verbalized understanding;Returned demonstration;Verbal cues required;Tactile cues required          PT Long Term Goals - 12/30/17 1451      PT LONG TERM GOAL #1   Title  Improve posture and alignment with patient to demonstrate improved upright posture with posterior shoulder girdle musculature activated 02/10/18    Time  12    Period  Weeks    Status  Revised      PT LONG TERM  GOAL #2   Title  Decrease frequency, intensity and duration of cervical and thoracic pain by 50-70% allowing patient to perform normal functional  activities with less pain and discomfort 02/10/18    Time  12    Period  Weeks    Status  Revised      PT LONG TERM GOAL #3   Title  Increase cervical ROM and mobiltiy by 5-8 degrees 02/10/18    Time  12    Period  Weeks    Status  Revised      PT LONG TERM GOAL #4   Title  Independent in HEP 02/10/18    Time  12    Period  Weeks    Status  Revised      PT LONG TERM GOAL #5   Title  Improve FOTO to </= 38% limitation 02/10/18    Time  12    Period  Weeks    Status  Revised            Plan - 12/30/17 1807    Clinical Impression Statement  Patient returns to resume PT. She has no change in symptoms. She continues to have pain in the Lt >Rt cervical area into bilat thoracic spine with poor cervical and thoracic posture; limited cervical and shoulder ROM/mobility; muscular tightness to palpation; hypomobility through cervical and thoracic spine. Posture is infulenced by large breast size with visable indentions in bilat shoudlers from bra straps. Patient will benefit from continued PT to address problems identified.     Clinical Presentation  Stable    Clinical Decision Making  Low    Rehab Potential  Good    Clinical Impairments Affecting Rehab Potential  forward head posture with work, home and exercise; large breast size    PT Frequency  1x / week    PT Duration  12 weeks    PT Treatment/Interventions  Patient/family education;ADLs/Self Care Home Management;Cryotherapy;Electrical Stimulation;Iontophoresis 38m/ml Dexamethasone;Moist Heat;Ultrasound;Dry needling;Manual techniques;Neuromuscular re-education;Therapeutic activities;Therapeutic exercise    PT Next Visit Plan  trial of DN vs continued manual work to neck/ thoracic musculature and pecs; progress postural strengthening.     Consulted and Agree with Plan of Care  Patient       Patient will benefit from skilled therapeutic intervention in order to improve the following deficits and impairments:  Postural dysfunction,  Improper body mechanics, Pain, Increased fascial restricitons, Increased muscle spasms, Decreased mobility, Decreased range of motion, Decreased activity tolerance, Decreased strength  Visit Diagnosis: Cervicalgia - Plan: PT plan of care cert/re-cert  Pain in thoracic spine - Plan: PT plan of care cert/re-cert  Other symptoms and signs involving the musculoskeletal system - Plan: PT plan of care cert/re-cert  Abnormal posture - Plan: PT plan of care cert/re-cert     Problem List Patient Active Problem List   Diagnosis Date Noted  . Breast hypertrophy in female 09/30/2017  . Muscle spasm 09/30/2017  . Upper back pain 09/30/2017  . Encounter for screening mammogram for malignant neoplasm of breast 04/09/2015  . Retrocalcaneal bursitis 11/02/2014  . Facet arthritis of lumbar region 11/02/2014  . Menopausal symptoms 05/07/2014  . SI (sacroiliac) joint dysfunction 03/26/2014    Tracy Franklin PNilda SimmerPT, MPH  12/30/2017, 6:17 PM  [facility] [address]Phone: [phone]  Fax:  [phone] Name: Tracy BowellMRN: [id number] of Birth: [birth year] PHYSICAL THERAPY DISCHARGE SUMMARY  Visits from Start of Care: 4  Current functional level related to goals / functional outcomes: See last progress note for discharge status   Remaining deficits: No significant change in symptoms    Education / Equipment: HEP  Plan: Patient agrees to discharge.  Patient goals were not met. Patient is being discharged due to the patient's request.  ?????     Patient called to cancel remaining appointments stating that she will continue with her independent HEP   Tracy Franklin Tracy Franklin. Helene Kelp PT, MPH 01/14/18 12:54 PM

## 2017-12-30 NOTE — Patient Instructions (Signed)
Flexibility: Corner Stretch    Standing in corner with hands just above shoulder level and feet __several__ inches from corner, lean forward until a comfortable stretch is felt across chest. Hold __30__ seconds. Repeat _3___ times per set.  Do __2-3__ sessions per day. Repeat at mid and higher level with arms   Resisted External Rotation: in Neutral - Bilateral   PALMS UP Sit or stand, tubing in both hands, elbows at sides, bent to 90, forearms forward. Pinch shoulder blades together and rotate forearms out. Keep elbows at sides. Repeat __10__ times per set. Do _2-3___ sets per session. Do _2-3___ sessions per day.   Low Row: Standing   Face anchor, feet shoulder width apart. Palms up, pull arms back, squeezing shoulder blades together. Repeat 10__ times per set. Do 2-3__ sets per session. Do 1_ sessions per week. Anchor Height: Waist    Strengthening: Resisted Extension   Hold tubing in right hand, arm forward. Pull arm back, elbow straight. Repeat _10___ times per set. Do 2-3____ sets per session. Do 1__ sessions per day.  Trigger Point Dry Needling  . What is Trigger Point Dry Needling (DN)? o DN is a physical therapy technique used to treat muscle pain and dysfunction. Specifically, DN helps deactivate muscle trigger points (muscle knots).  o A thin filiform needle is used to penetrate the skin and stimulate the underlying trigger point. The goal is for a local twitch response (LTR) to occur and for the trigger point to relax. No medication of any kind is injected during the procedure.   . What Does Trigger Point Dry Needling Feel Like?  o The procedure feels different for each individual patient. Some patients report that they do not actually feel the needle enter the skin and overall the process is not painful. Very mild bleeding may occur. However, many patients feel a deep cramping in the muscle in which the needle was inserted. This is the local twitch response.    Marland Kitchen. How Will I feel after the treatment? o Soreness is normal, and the onset of soreness may not occur for a few hours. Typically this soreness does not last longer than two days.  o Bruising is uncommon, however; ice can be used to decrease any possible bruising.  o In rare cases feeling tired or nauseous after the treatment is normal. In addition, your symptoms may get worse before they get better, this period will typically not last longer than 24 hours.   . What Can I do After My Treatment? o Increase your hydration by drinking more water for the next 24 hours. o You may place ice or heat on the areas treated that have become sore, however, do not use heat on inflamed or bruised areas. Heat often brings more relief post needling. o You can continue your regular activities, but vigorous activity is not recommended initially after the treatment for 24 hours. o DN is best combined with other physical therapy such as strengthening, stretching, and other therapies.

## 2018-01-05 ENCOUNTER — Encounter: Payer: BC Managed Care – PPO | Admitting: Rehabilitative and Restorative Service Providers"

## 2018-01-06 LAB — HM MAMMOGRAPHY

## 2018-01-14 ENCOUNTER — Encounter: Payer: BC Managed Care – PPO | Admitting: Rehabilitative and Restorative Service Providers"

## 2018-01-14 ENCOUNTER — Encounter: Payer: Self-pay | Admitting: Physician Assistant

## 2018-01-17 ENCOUNTER — Encounter: Payer: Self-pay | Admitting: Physician Assistant

## 2018-05-31 ENCOUNTER — Encounter: Payer: Self-pay | Admitting: Internal Medicine

## 2018-05-31 ENCOUNTER — Ambulatory Visit: Payer: BC Managed Care – PPO | Admitting: Internal Medicine

## 2018-05-31 VITALS — BP 116/78 | HR 56 | Temp 97.9°F | Ht 65.0 in | Wt 159.8 lb

## 2018-05-31 DIAGNOSIS — M545 Low back pain: Secondary | ICD-10-CM

## 2018-05-31 DIAGNOSIS — N952 Postmenopausal atrophic vaginitis: Secondary | ICD-10-CM

## 2018-05-31 DIAGNOSIS — M85859 Other specified disorders of bone density and structure, unspecified thigh: Secondary | ICD-10-CM

## 2018-05-31 DIAGNOSIS — G8929 Other chronic pain: Secondary | ICD-10-CM | POA: Diagnosis not present

## 2018-05-31 DIAGNOSIS — N951 Menopausal and female climacteric states: Secondary | ICD-10-CM

## 2018-05-31 DIAGNOSIS — N62 Hypertrophy of breast: Secondary | ICD-10-CM

## 2018-05-31 NOTE — Patient Instructions (Signed)
Bone Health Bones protect organs, store calcium, and anchor muscles. Good health habits, such as eating nutritious foods and exercising regularly, are important for maintaining healthy bones. They can also help to prevent a condition that causes bones to lose density and become weak and brittle (osteoporosis). Why is bone mass important? Bone mass refers to the amount of bone tissue that you have. The higher your bone mass, the stronger your bones. An important step toward having healthy bones throughout life is to have strong and dense bones during childhood. A young adult who has a high bone mass is more likely to have a high bone mass later in life. Bone mass at its greatest it is called peak bone mass. A large decline in bone mass occurs in older adults. In women, it occurs about the time of menopause. During this time, it is important to practice good health habits, because if more bone is lost than what is replaced, the bones will become less healthy and more likely to break (fracture). If you find that you have a low bone mass, you may be able to prevent osteoporosis or further bone loss by changing your diet and lifestyle. How can I find out if my bone mass is low? Bone mass can be measured with an X-ray test that is called a bone mineral density (BMD) test. This test is recommended for all women who are age 65 or older. It may also be recommended for men who are age 70 or older, or for people who are more likely to develop osteoporosis due to:  Having bones that break easily.  Having a long-term disease that weakens bones, such as kidney disease or rheumatoid arthritis.  Having menopause earlier than normal.  Taking medicine that weakens bones, such as steroids, thyroid hormones, or hormone treatment for breast cancer or prostate cancer.  Smoking.  Drinking three or more alcoholic drinks each day.  What are the nutritional recommendations for healthy bones? To have healthy bones, you  need to get enough of the right minerals and vitamins. Most nutrition experts recommend getting these nutrients from the foods that you eat. Nutritional recommendations vary from person to person. Ask your health care provider what is healthy for you. Here are some general guidelines. Calcium Recommendations Calcium is the most important (essential) mineral for bone health. Most people can get enough calcium from their diet, but supplements may be recommended for people who are at risk for osteoporosis. Good sources of calcium include:  Dairy products, such as low-fat or nonfat milk, cheese, and yogurt.  Dark green leafy vegetables, such as bok choy and broccoli.  Calcium-fortified foods, such as orange juice, cereal, bread, soy beverages, and tofu products.  Nuts, such as almonds.  Follow these recommended amounts for daily calcium intake:  Children, age 1?3: 700 mg.  Children, age 4?8: 1,000 mg.  Children, age 9?13: 1,300 mg.  Teens, age 14?18: 1,300 mg.  Adults, age 19?50: 1,000 mg.  Adults, age 51?70: ? Men: 1,000 mg. ? Women: 1,200 mg.  Adults, age 71 or older: 1,200 mg.  Pregnant and breastfeeding females: ? Teens: 1,300 mg. ? Adults: 1,000 mg.  Vitamin D Recommendations Vitamin D is the most essential vitamin for bone health. It helps the body to absorb calcium. Sunlight stimulates the skin to make vitamin D, so be sure to get enough sunlight. If you live in a cold climate or you do not get outside often, your health care provider may recommend that you take vitamin   D supplements. Good sources of vitamin D in your diet include:  Egg yolks.  Saltwater fish.  Milk and cereal fortified with vitamin D.  Follow these recommended amounts for daily vitamin D intake:  Children and teens, age 1?18: 600 international units.  Adults, age 50 or younger: 400-800 international units.  Adults, age 51 or older: 800-1,000 international units.  Other Nutrients Other nutrients  for bone health include:  Phosphorus. This mineral is found in meat, poultry, dairy foods, nuts, and legumes. The recommended daily intake for adult men and adult women is 700 mg.  Magnesium. This mineral is found in seeds, nuts, dark green vegetables, and legumes. The recommended daily intake for adult men is 400?420 mg. For adult women, it is 310?320 mg.  Vitamin K. This vitamin is found in green leafy vegetables. The recommended daily intake is 120 mg for adult men and 90 mg for adult women.  What type of physical activity is best for building and maintaining healthy bones? Weight-bearing and strength-building activities are important for building and maintaining peak bone mass. Weight-bearing activities cause muscles and bones to work against gravity. Strength-building activities increases muscle strength that supports bones. Weight-bearing and muscle-building activities include:  Walking and hiking.  Jogging and running.  Dancing.  Gym exercises.  Lifting weights.  Tennis and racquetball.  Climbing stairs.  Aerobics.  Adults should get at least 30 minutes of moderate physical activity on most days. Children should get at least 60 minutes of moderate physical activity on most days. Ask your health care provide what type of exercise is best for you. Where can I find more information? For more information, check out the following websites:  National Osteoporosis Foundation: http://nof.org/learn/basics  National Institutes of Health: http://www.niams.nih.gov/Health_Info/Bone/Bone_Health/bone_health_for_life.asp  This information is not intended to replace advice given to you by your health care provider. Make sure you discuss any questions you have with your health care provider. Document Released: 08/29/2003 Document Revised: 12/27/2015 Document Reviewed: 06/13/2014 Elsevier Interactive Patient Education  2018 Elsevier Inc.  

## 2018-06-01 LAB — HEPATIC FUNCTION PANEL
ALT: 20 IU/L (ref 0–32)
AST: 15 IU/L (ref 0–40)
Albumin: 4.7 g/dL (ref 3.5–5.5)
Alkaline Phosphatase: 73 IU/L (ref 39–117)
Bilirubin Total: <0.2 mg/dL (ref 0.0–1.2)
Bilirubin, Direct: 0.07 mg/dL (ref 0.00–0.40)
Total Protein: 7.1 g/dL (ref 6.0–8.5)

## 2018-06-01 LAB — VITAMIN D 25 HYDROXY (VIT D DEFICIENCY, FRACTURES): Vit D, 25-Hydroxy: 38.9 ng/mL (ref 30.0–100.0)

## 2018-06-01 NOTE — Progress Notes (Signed)
Here are your lab results:  Your vitamin D level is low end of normal. I suggest you take 4000 units of vitamin D from November 1 through March 31st (less sunlight). You may be able to resume 2000 unit dosing on April 1st. Let me know of you have any issues with the higher dosing.   Your liver function is normal.   Happy holidays to you and your family!  Sincerely,    Mahoganie Basher N. Allyne GeeSanders, MD

## 2018-06-03 ENCOUNTER — Encounter: Payer: Self-pay | Admitting: Internal Medicine

## 2018-06-05 ENCOUNTER — Encounter: Payer: Self-pay | Admitting: Internal Medicine

## 2018-06-05 NOTE — Progress Notes (Signed)
  Subjective:     Patient ID: Tracy Franklin , female    DOB: 1971-02-20 , 47 y.o.   MRN: [id number]   Chief Complaint  Patient presents with  . Hormones f/u    HPI  She is here today for f/u BHRT. She feels well on her current regimen, progesterone nightly except Sundays. She had dexa scan performed recently at [facility] office. She did bring a copy today to include in her chart. She is also up to date with her mammogram.     History reviewed. No pertinent past medical history.   Family History  Problem Relation Age of Onset  . Cancer Father        prostate  . Hyperlipidemia Father   . Early death Mother   . Asthma Mother      Current Outpatient Medications:  .  Calcium-Magnesium 250-125 MG TABS, Take by mouth., Disp: , Rfl:  .  Cholecalciferol (VITAMIN D) 2000 UNITS CAPS, Take 5,000 Units by mouth., Disp: , Rfl:  .  Multiple Vitamins-Minerals (MULTIVITAMIN WITH MINERALS) tablet, Take 1 tablet by mouth daily., Disp: , Rfl:  .  Progesterone Micronized (PROGESTERONE COMPOUNDING KIT TD), Place onto the skin., Disp: , Rfl:  .  VITAMIN E EX, Apply topically., Disp: , Rfl:  .  estradiol (ESTRACE) 0.1 MG/GM vaginal cream, Use 1g vaginally qhs x 2 weeks, then 2x/week, Disp: , Rfl:    No Known Allergies   Review of Systems  Constitutional: Negative.   Respiratory: Negative.   Cardiovascular: Negative.   Gastrointestinal: Negative.   Musculoskeletal: Positive for back pain.  Neurological: Negative.   Psychiatric/Behavioral: Negative.      Today's Vitals   05/31/18 1439  BP: 116/78  Pulse: (!) 56  Temp: 97.9 F (36.6 C)  Weight: 159 lb 12.8 oz (72.5 kg)  Height: '5\' 5"'$  (1.651 m)   Body mass index is 26.59 kg/m.   Objective:  Physical Exam Vitals signs and nursing note reviewed.  Constitutional:      Appearance: Normal appearance.  HENT:     Head: Normocephalic and atraumatic.  Cardiovascular:     Rate and Rhythm: Normal rate and regular rhythm.     Heart sounds: Normal  heart sounds.  Pulmonary:     Effort: Pulmonary effort is normal.     Breath sounds: Normal breath sounds.  Skin:    General: Skin is warm and dry.  Neurological:     General: No focal deficit present.     Mental Status: She is alert.  Psychiatric:        Mood and Affect: Mood normal.         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 She will rto in four months for re-evaluation. Refills will be called into compounding pharmacy.  - Liver Profile  2. Osteopenia of hip, unspecified laterality  We reviewed her most recent dexa scan. Importance of weight-bearing exercises, calcium and vitamin d supplementation was discussed with the patient.   - Vitamin D (25 hydroxy)  3. Chronic bilateral low back pain without sciatica  Chronic. I do think her sx are due to macromastia. She may benefit from physical therapy and pilates. It is important that she strengthen her core.   4. Macromastia  Chronic.   5. Atrophic vaginitis  She will continue with vitamin E suppositories vaginally. She does not wish to use hormonal preparations.   [name], MD

## 2018-06-17 ENCOUNTER — Encounter: Payer: Self-pay | Admitting: Internal Medicine

## 2018-06-27 ENCOUNTER — Ambulatory Visit (INDEPENDENT_AMBULATORY_CARE_PROVIDER_SITE_OTHER): Payer: BC Managed Care – PPO | Admitting: Physician Assistant

## 2018-06-27 ENCOUNTER — Encounter: Payer: Self-pay | Admitting: Physician Assistant

## 2018-06-27 VITALS — BP 133/78 | HR 66 | Ht 65.0 in | Wt 161.0 lb

## 2018-06-27 DIAGNOSIS — R110 Nausea: Secondary | ICD-10-CM | POA: Diagnosis not present

## 2018-06-27 DIAGNOSIS — K921 Melena: Secondary | ICD-10-CM | POA: Diagnosis not present

## 2018-06-27 DIAGNOSIS — R82998 Other abnormal findings in urine: Secondary | ICD-10-CM | POA: Diagnosis not present

## 2018-06-27 DIAGNOSIS — R1011 Right upper quadrant pain: Secondary | ICD-10-CM

## 2018-06-27 DIAGNOSIS — R1013 Epigastric pain: Secondary | ICD-10-CM | POA: Diagnosis not present

## 2018-06-27 DIAGNOSIS — R197 Diarrhea, unspecified: Secondary | ICD-10-CM

## 2018-06-27 DIAGNOSIS — M549 Dorsalgia, unspecified: Secondary | ICD-10-CM

## 2018-06-27 LAB — POCT URINALYSIS DIPSTICK
BILIRUBIN UA: NEGATIVE
GLUCOSE UA: NEGATIVE
Ketones, UA: NEGATIVE
Leukocytes, UA: NEGATIVE
Nitrite, UA: NEGATIVE
Protein, UA: NEGATIVE
RBC UA: NEGATIVE
Spec Grav, UA: <=1.005 — AB (ref 1.010–1.025)
Urobilinogen, UA: 0.2 E.U./dL
pH, UA: 5.5 (ref 5.0–8.0)

## 2018-06-27 MED ORDER — PANTOPRAZOLE SODIUM 40 MG PO TBEC: 40.0000 mg | DELAYED_RELEASE_TABLET | Freq: Every day | ORAL | 1 refills | Status: DC

## 2018-06-27 MED ORDER — ALUM & MAG HYDROXIDE-SIMETH 200-200-20 MG/5ML PO SUSP
30.0000 mL | Freq: Once | ORAL | Status: AC
  Administered 2018-06-27: 30 mL via ORAL

## 2018-06-27 MED ORDER — SUCRALFATE 1 G PO TABS: 1.0000 g | ORAL_TABLET | Freq: Three times a day (TID) | ORAL | 0 refills | Status: DC

## 2018-06-27 NOTE — Progress Notes (Signed)
Subjective:    Patient ID: Tracy Franklin, female    DOB: 1970/12/08, 48 y.o.   MRN: [id number]  HPI  Pt is a 48 yo female who presents to the clinic with 2-3 months of bloating and upper abdomen discomfort. She thought it could be her diet over the holidays. She usually eats pretty clean but over the holidays she eats whatever she wants. Over the past 2-3 days she has been in a lot more discomfort. She was not able to go to church yesterday or work today. Nadeen Landauorginally was a little constipated but then had some loose stools. She drank a whole bottle of kaeopeptate with little relief. She does admit to some black stools.  She is nauseated but no vomiting. She is having some dark urine but no pain with urination. No known food trigger. No hx of GERD.  She has a hx of being sensitive to sugar and causing fibromyaglia to flare.   .. Active Ambulatory Problems    Diagnosis Date Noted  . SI (sacroiliac) joint dysfunction 03/26/2014  . Menopausal symptoms 05/07/2014  . Retrocalcaneal bursitis 11/02/2014  . Facet arthritis of lumbar region 11/02/2014  . Encounter for screening mammogram for malignant neoplasm of breast 04/09/2015  . Breast hypertrophy in female 09/30/2017  . Muscle spasm 09/30/2017  . Upper back pain 09/30/2017   Resolved Ambulatory Problems    Diagnosis Date Noted  . No Resolved Ambulatory Problems   No Additional Past Medical History     Review of Systems    see HPI.  Objective:   Physical Exam Vitals signs reviewed.  Constitutional:      Appearance: Normal appearance.  HENT:     Head: Normocephalic and atraumatic.  Cardiovascular:     Rate and Rhythm: Normal rate and regular rhythm.     Pulses: Normal pulses.  Pulmonary:     Effort: Pulmonary effort is normal.     Breath sounds: Normal breath sounds.  Abdominal:     General: Bowel sounds are normal. There is distension.     Tenderness: There is abdominal tenderness. There is no right CVA tenderness, left CVA  tenderness, guarding or rebound.     Comments: More tenderness over epigastric area than RUQ. No rebound or guarding.   Neurological:     General: No focal deficit present.     Mental Status: She is alert.  Psychiatric:        Mood and Affect: Mood normal.           Assessment & Plan:  Marland Kitchen.Marland Kitchen.Delice Bisonara was seen today for gastroesophageal reflux.  Diagnoses and all orders for this visit:  Epigastric pain -     US ABDOMEN LIMITED RUQ -     CBC with Differential/Platelet -     COMPLETE METABOLIC PANEL WITH GFR -     Lipase -     Amylase -     pantoprazole (PROTONIX) 40 MG tablet; Take 1 tablet (40 mg total) by mouth daily. -     sucralfate (CARAFATE) 1 g tablet; Take 1 tablet (1 g total) by mouth 4 (four) times daily -  with meals and at bedtime. -     alum & mag hydroxide-simeth (MAALOX/MYLANTA) 200-200-20 MG/5ML suspension 30 mL -     H. pylori breath test  Dark urine -     CBC with Differential/Platelet -     COMPLETE METABOLIC PANEL WITH GFR -     Lipase -     Amylase -  POCT Urinalysis Dipstick  Nausea -     US ABDOMEN LIMITED RUQ -     CBC with Differential/Platelet -     COMPLETE METABOLIC PANEL WITH GFR -     Lipase -     Amylase -     pantoprazole (PROTONIX) 40 MG tablet; Take 1 tablet (40 mg total) by mouth daily. -     sucralfate (CARAFATE) 1 g tablet; Take 1 tablet (1 g total) by mouth 4 (four) times daily -  with meals and at bedtime.  Black stools -     CBC with Differential/Platelet -     COMPLETE METABOLIC PANEL WITH GFR -     Lipase -     Amylase -     pantoprazole (PROTONIX) 40 MG tablet; Take 1 tablet (40 mg total) by mouth daily. -     sucralfate (CARAFATE) 1 g tablet; Take 1 tablet (1 g total) by mouth 4 (four) times daily -  with meals and at bedtime.  Diarrhea, unspecified type -     US ABDOMEN LIMITED RUQ -     CBC with Differential/Platelet -     COMPLETE METABOLIC PANEL WITH GFR -     Lipase -     Amylase  Right upper quadrant pain -      CBC with Differential/Platelet -     COMPLETE METABOLIC PANEL WITH GFR -     Lipase -     Amylase -     pantoprazole (PROTONIX) 40 MG tablet; Take 1 tablet (40 mg total) by mouth daily. -     sucralfate (CARAFATE) 1 g tablet; Take 1 tablet (1 g total) by mouth 4 (four) times daily -  with meals and at bedtime.  Pain radiating to back -     CBC with Differential/Platelet -     COMPLETE METABOLIC PANEL WITH GFR -     Lipase -     Amylase -     pantoprazole (PROTONIX) 40 MG tablet; Take 1 tablet (40 mg total) by mouth daily. -     sucralfate (CARAFATE) 1 g tablet; Take 1 tablet (1 g total) by mouth 4 (four) times daily -  with meals and at bedtime.    .. Results for orders placed or performed in visit on 06/27/18  POCT Urinalysis Dipstick  Result Value Ref Range   Color, UA yellow    Clarity, UA clear    Glucose, UA Negative Negative   Bilirubin, UA negative    Ketones, UA negative    Spec Grav, UA <=1.005 (A) 1.010 - 1.025   Blood, UA negative    pH, UA 5.5 5.0 - 8.0   Protein, UA Negative Negative   Urobilinogen, UA 0.2 0.2 or 1.0 E.U./dL   Nitrite, UA negative    Leukocytes, UA Negative Negative   Appearance     Odor     Will culture.  Unclear etiology gastritis vs pancreatitis vs cholecystitis vs PUD. As of now treated with GI cocktail, carafate and protonix for gastritis. Will get abdomen ultrasound. H.pylori done in office today. CBC , CMP, lipase, amylase drawn today. Follow up if symptoms worsening or as needed.

## 2018-06-27 NOTE — Patient Instructions (Signed)
Gastritis, Adult    Gastritis is swelling (inflammation) of the stomach. Gastritis can develop quickly (acute). It can also develop slowly over time (chronic). It is important to get help for this condition. If you do not get help, your stomach can bleed, and you can get sores (ulcers) in your stomach.  What are the causes?  This condition may be caused by:   Germs that get to your stomach.   Drinking too much alcohol.   Medicines you are taking.   Too much acid in the stomach.   A disease of the intestines or stomach.   Stress.   An allergic reaction.   Crohn's disease.   Some cancer treatments (radiation).  Sometimes the cause of this condition is not known.  What are the signs or symptoms?  Symptoms of this condition include:   Pain in your stomach.   A burning feeling in your stomach.   Feeling sick to your stomach (nauseous).   Throwing up (vomiting).   Feeling too full after you eat.   Weight loss.   Bad breath.   Throwing up blood.   Blood in your poop (stool).  How is this diagnosed?  This condition may be diagnosed with:   Your medical history and symptoms.   A physical exam.   Tests. These can include:  ? Blood tests.  ? Stool tests.  ? A procedure to look inside your stomach (upper endoscopy).  ? A test in which a sample of tissue is taken for testing (biopsy).  How is this treated?  Treatment for this condition depends on what caused it. You may be given:   Antibiotic medicine, if your condition was caused by germs.   H2 blockers and similar medicines, if your condition was caused by too much acid.  Follow these instructions at home:  Medicines   Take over-the-counter and prescription medicines only as told by your doctor.   If you were prescribed an antibiotic medicine, take it as told by your doctor. Do not stop taking it even if you start to feel better.  Eating and drinking     Eat small meals often, instead of large meals.   Avoid foods and drinks that make your symptoms  worse.   Drink enough fluid to keep your pee (urine) pale yellow.  Alcohol use   Do not drink alcohol if:  ? Your doctor tells you not to drink.  ? You are pregnant, may be pregnant, or are planning to become pregnant.   If you drink alcohol:  ? Limit your use to:   0-1 drink a day for women.   0-2 drinks a day for men.  ? Be aware of how much alcohol is in your drink. In the [place] one drink equals one 12 oz bottle of beer (355 mL), one 5 oz glass of wine (148 mL), or one 1 oz glass of hard liquor (44 mL).  General instructions   Talk with your doctor about ways to manage stress. You can exercise or do deep breathing, meditation, or yoga.   Do not smoke or use products that have nicotine or tobacco. If you need help quitting, ask your doctor.   Keep all follow-up visits as told by your doctor. This is important.  Contact a doctor if:   Your symptoms get worse.   Your symptoms go away and then come back.  Get help right away if:   You throw up blood or something that looks like coffee   grounds.   You have black or dark red poop.   You throw up any time you try to drink fluids.   Your stomach pain gets worse.   You have a fever.   You do not feel better after one week.  Summary   Gastritis is swelling (inflammation) of the stomach.   You must get help for this condition. If you do not get help, your stomach can bleed, and you can get sores (ulcers).   This condition is diagnosed with medical history, physical exam, or tests.   You can be treated with medicines for germs or medicines to block too much acid in your stomach.  This information is not intended to replace advice given to you by your health care provider. Make sure you discuss any questions you have with your health care provider.  Document Released: 11/25/2007 Document Revised: 10/26/2017 Document Reviewed: 10/26/2017  Elsevier Interactive Patient Education  2019 Elsevier Inc.

## 2018-06-28 ENCOUNTER — Ambulatory Visit (INDEPENDENT_AMBULATORY_CARE_PROVIDER_SITE_OTHER): Payer: BC Managed Care – PPO

## 2018-06-28 DIAGNOSIS — R110 Nausea: Secondary | ICD-10-CM

## 2018-06-28 DIAGNOSIS — R197 Diarrhea, unspecified: Secondary | ICD-10-CM | POA: Diagnosis not present

## 2018-06-28 DIAGNOSIS — R1013 Epigastric pain: Secondary | ICD-10-CM

## 2018-06-28 LAB — COMPLETE METABOLIC PANEL WITH GFR
AG Ratio: 2 (calc) (ref 1.0–2.5)
ALT: 16 U/L (ref 6–29)
AST: 16 U/L (ref 10–35)
Albumin: 4.8 g/dL (ref 3.6–5.1)
Alkaline phosphatase (APISO): 58 U/L (ref 33–115)
BUN: 8 mg/dL (ref 7–25)
CO2: 28 mmol/L (ref 20–32)
Calcium: 9.9 mg/dL (ref 8.6–10.2)
Chloride: 103 mmol/L (ref 98–110)
Creat: 0.76 mg/dL (ref 0.50–1.10)
GFR, EST NON AFRICAN AMERICAN: 93 mL/min/{1.73_m2} (ref >=60)
GFR, Est African American: 108 mL/min/{1.73_m2} (ref >=60)
Globulin: 2.4 g/dL (calc) (ref 1.9–3.7)
Glucose, Bld: 100 mg/dL — ABNORMAL HIGH (ref 65–99)
Potassium: 4.6 mmol/L (ref 3.5–5.3)
Sodium: 139 mmol/L (ref 135–146)
Total Bilirubin: 0.4 mg/dL (ref 0.2–1.2)
Total Protein: 7.2 g/dL (ref 6.1–8.1)

## 2018-06-28 LAB — CBC WITH DIFFERENTIAL/PLATELET
Absolute Monocytes: 409 cells/uL (ref 200–950)
BASOS ABS: 22 {cells}/uL (ref 0–200)
Basophils Relative: 0.4 %
Eosinophils Absolute: 78 cells/uL (ref 15–500)
Eosinophils Relative: 1.4 %
HCT: 43 % (ref 35.0–45.0)
Hemoglobin: 14.3 g/dL (ref 11.7–15.5)
Lymphs Abs: 1708 cells/uL (ref 850–3900)
MCH: 30.5 pg (ref 27.0–33.0)
MCHC: 33.3 g/dL (ref 32.0–36.0)
MCV: 91.7 fL (ref 80.0–100.0)
MONOS PCT: 7.3 %
MPV: 11.1 fL (ref 7.5–12.5)
NEUTROS ABS: 3382 {cells}/uL (ref 1500–7800)
Neutrophils Relative %: 60.4 %
Platelets: 180 10*3/uL (ref 140–400)
RBC: 4.69 10*6/uL (ref 3.80–5.10)
RDW: 11.7 % (ref 11.0–15.0)
Total Lymphocyte: 30.5 %
WBC: 5.6 10*3/uL (ref 3.8–10.8)

## 2018-06-28 LAB — H. PYLORI BREATH TEST: H. pylori Breath Test: NOT DETECTED

## 2018-06-28 LAB — AMYLASE: Amylase: 60 U/L (ref 21–101)

## 2018-06-28 LAB — LIPASE: Lipase: 37 U/L (ref 7–60)

## 2018-06-28 NOTE — Progress Notes (Signed)
Call pt: kidney, liver, glucose look good.  Pancreatic enzymes look great. No pancreatitis.   WBC normal.   Pending h.pylori.

## 2018-06-28 NOTE — Progress Notes (Signed)
Call pt: normal u/s of gallbladder. No gallstones. How are symptoms today? Treatment plan stays the same. Dx seems likely to be gastritis.

## 2018-06-28 NOTE — Progress Notes (Signed)
Still waiting for h.pylori will call with results.

## 2018-06-29 NOTE — Progress Notes (Signed)
Call pt: negative for h.pylori. for ulcers/gastritis to heal 4-6 weeks of therapy is needed but you should begin seeing more and more improvement. How are you doing?

## 2018-06-29 NOTE — Addendum Note (Signed)
Addended by: Jomarie Longs on: 06/29/2018 12:01 PM   Modules accepted: Orders

## 2018-06-29 NOTE — Progress Notes (Signed)
Ok done

## 2018-11-29 ENCOUNTER — Encounter: Payer: Self-pay | Admitting: Internal Medicine

## 2018-11-29 ENCOUNTER — Ambulatory Visit: Payer: BC Managed Care – PPO | Admitting: Internal Medicine

## 2018-11-29 ENCOUNTER — Other Ambulatory Visit: Payer: Self-pay

## 2018-11-29 VITALS — BP 112/68 | HR 67 | Temp 97.8°F | Ht 65.0 in | Wt 161.4 lb

## 2018-11-29 DIAGNOSIS — N952 Postmenopausal atrophic vaginitis: Secondary | ICD-10-CM

## 2018-11-29 DIAGNOSIS — E559 Vitamin D deficiency, unspecified: Secondary | ICD-10-CM | POA: Diagnosis not present

## 2018-11-29 DIAGNOSIS — N951 Menopausal and female climacteric states: Secondary | ICD-10-CM | POA: Diagnosis not present

## 2018-11-29 DIAGNOSIS — M542 Cervicalgia: Secondary | ICD-10-CM

## 2018-11-29 NOTE — Progress Notes (Signed)
Subjective:     Patient ID: Tracy Franklin , female    DOB: 07/28/1970 , 48 y.o.   MRN: [id number]   Chief Complaint  Patient presents with  . Hormones f/u    HPI  She is here today for f/u BHRT. She is doing well on her current regimen.  She is currently taking progesterone 4% cream 1/2 cc nightly except Sundays and she uses vitamin E vaginal suppositories nightly as needed. She does not wish to use estrogen products at all. Estradiol cream prescribed by GYN, but she did not get this filled.  She reports she is surviving the pandemic, but it has been stressful. She has been working from home.    Past Medical History:  Diagnosis Date  . Atrophic vaginitis      Family History  Problem Relation Age of Onset  . Cancer Father        prostate  . Hyperlipidemia Father   . Early death Mother   . Asthma Mother      Current Outpatient Medications:  .  Calcium-Magnesium 250-125 MG TABS, Take by mouth., Disp: , Rfl:  .  Cholecalciferol (VITAMIN D) 2000 UNITS CAPS, Take 5,000 Units by mouth., Disp: , Rfl:  .  Multiple Vitamins-Minerals (MULTIVITAMIN WITH MINERALS) tablet, Take 1 tablet by mouth daily., Disp: , Rfl:  .  Progesterone Micronized (PROGESTERONE COMPOUNDING KIT TD), Place onto the skin., Disp: , Rfl:  .  VITAMIN E EX, Apply topically., Disp: , Rfl:    No Known Allergies   Review of Systems  Constitutional: Negative.   Respiratory: Negative.   Cardiovascular: Negative.   Gastrointestinal: Negative.   Musculoskeletal: Positive for back pain and neck pain.  Neurological: Negative.   Psychiatric/Behavioral: Negative.      Today's Vitals   11/29/18 0843  BP: 112/68  Pulse: 67  Temp: 97.8 F (36.6 C)  TempSrc: Oral  Weight: 161 lb 6.4 oz (73.2 kg)  Height: 5' 5" (1.651 m)   Body mass index is 26.86 kg/m.   Objective:  Physical Exam Vitals signs and nursing note reviewed.  Constitutional:      Appearance: Normal appearance.  HENT:     Head: Normocephalic  and atraumatic.  Cardiovascular:     Rate and Rhythm: Normal rate and regular rhythm.     Heart sounds: Normal heart sounds.  Pulmonary:     Effort: Pulmonary effort is normal.     Breath sounds: Normal breath sounds.  Skin:    General: Skin is warm.  Neurological:     General: No focal deficit present.     Mental Status: She is alert.  Psychiatric:        Mood and Affect: Mood normal.        Behavior: Behavior normal.         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 Refills will be called into [facility]. She will rto in six months for re-evaluation. She agrees to complete a partial kit prior to her visit.   2. Atrophic vaginitis  Chronic. She has done well with vitamin E suppositories. Again, she does not wish to use estrogen products.   3. Vitamin D deficiency disease  I WILL CHECK A VIT D LEVEL AND SUPPLEMENT AS NEEDED.  ALSO ENCOURAGED TO SPEND 15 MINUTES IN THE SUN DAILY.  - Vitamin D (25 hydroxy)  4. Cervicalgia  Chronic. Likely exacerbated by macromastia. She is   encouraged to consider dry needling.   [name], MD    THE PATIENT IS ENCOURAGED TO PRACTICE SOCIAL DISTANCING DUE TO THE COVID-19 PANDEMIC.

## 2018-11-29 NOTE — Patient Instructions (Signed)
Atrophic Vaginitis Atrophic vaginitis is a condition in which the tissues that line the vagina become dry and thin. This condition occurs in women who have stopped having their period. It is caused by a drop in a female hormone (estrogen). This hormone helps:  To keep the vagina moist.  To make a clear fluid. This clear fluid helps: ? To make the vagina ready for sex. ? To protect the vagina from infection. If the lining of the vagina is dry and thin, it may cause irritation, burning, or itchiness. It may also:  Make sex painful.  Make an exam of your vagina painful.  Cause bleeding.  Make you lose interest in sex.  Cause a burning feeling when you pee (urinate).  Cause a brown or yellow fluid to come from your vagina. Some women do not have symptoms. Follow these instructions at home: Medicines  Take over-the-counter and prescription medicines only as told by your doctor.  Do not use herbs or other medicines unless your doctor says it is okay.  Use medicines for for dryness. These include: ? Oils to make the vagina soft. ? Creams. ? Moisturizers. General instructions  Do not douche.  Do not use products that can make your vagina dry. These include: ? Scented sprays. ? Scented tampons. ? Scented soaps.  Sex can help increase blood flow and soften the tissue in the vagina. If it hurts to have sex: ? Tell your partner. ? Use products to make sex more comfortable. Use these only as told by your doctor. Contact a doctor if you:  Have discharge from the vagina that is different than usual.  Have a bad smell coming from your vagina.  Have new symptoms.  Do not get better.  Get worse. Summary  Atrophic vaginitis is a condition in which the lining of the vagina becomes dry and thin.  This condition affects women who have stopped having their periods.  Treatment may include using products that help make the vagina soft.  Call a doctor if do not get better with  treatment. This information is not intended to replace advice given to you by your health care provider. Make sure you discuss any questions you have with your health care provider. Document Released: 11/25/2007 Document Revised: 06/21/2017 Document Reviewed: 06/21/2017 Elsevier Interactive Patient Education  2019 Elsevier Inc.  

## 2018-11-30 LAB — VITAMIN D 25 HYDROXY (VIT D DEFICIENCY, FRACTURES): Vit D, 25-Hydroxy: 57.2 ng/mL (ref 30.0–100.0)

## 2018-12-06 ENCOUNTER — Encounter: Payer: Self-pay | Admitting: Internal Medicine

## 2019-01-10 LAB — HM MAMMOGRAPHY

## 2019-01-13 ENCOUNTER — Encounter: Payer: Self-pay | Admitting: Physician Assistant

## 2019-02-12 ENCOUNTER — Encounter: Payer: Self-pay | Admitting: Internal Medicine

## 2019-06-01 ENCOUNTER — Encounter: Payer: Self-pay | Admitting: Internal Medicine

## 2019-06-01 ENCOUNTER — Ambulatory Visit: Payer: BC Managed Care – PPO | Admitting: Internal Medicine

## 2019-06-01 VITALS — BP 108/62 | HR 69 | Temp 97.3°F | Ht 65.0 in | Wt 166.2 lb

## 2019-06-01 DIAGNOSIS — N952 Postmenopausal atrophic vaginitis: Secondary | ICD-10-CM

## 2019-06-01 DIAGNOSIS — Z79899 Other long term (current) drug therapy: Secondary | ICD-10-CM

## 2019-06-01 DIAGNOSIS — N951 Menopausal and female climacteric states: Secondary | ICD-10-CM

## 2019-06-02 LAB — CMP14+EGFR
ALT: 19 IU/L (ref 0–32)
AST: 18 IU/L (ref 0–40)
Albumin/Globulin Ratio: 2 (ref 1.2–2.2)
Albumin: 5 g/dL — ABNORMAL HIGH (ref 3.8–4.8)
Alkaline Phosphatase: 82 IU/L (ref 39–117)
BUN/Creatinine Ratio: 15 (ref 9–23)
BUN: 15 mg/dL (ref 6–24)
Bilirubin Total: <0.2 mg/dL (ref 0.0–1.2)
CO2: 20 mmol/L (ref 20–29)
Calcium: 10 mg/dL (ref 8.7–10.2)
Chloride: 103 mmol/L (ref 96–106)
Creatinine, Ser: 1.03 mg/dL — ABNORMAL HIGH (ref 0.57–1.00)
GFR calc Af Amer: 74 mL/min/{1.73_m2} (ref >59)
GFR calc non Af Amer: 64 mL/min/{1.73_m2} (ref >59)
Globulin, Total: 2.5 g/dL (ref 1.5–4.5)
Glucose: 87 mg/dL (ref 65–99)
Potassium: 4.8 mmol/L (ref 3.5–5.2)
Sodium: 139 mmol/L (ref 134–144)
Total Protein: 7.5 g/dL (ref 6.0–8.5)

## 2019-06-14 ENCOUNTER — Ambulatory Visit (INDEPENDENT_AMBULATORY_CARE_PROVIDER_SITE_OTHER): Payer: BC Managed Care – PPO

## 2019-06-14 ENCOUNTER — Ambulatory Visit (INDEPENDENT_AMBULATORY_CARE_PROVIDER_SITE_OTHER): Payer: BC Managed Care – PPO | Admitting: Sports Medicine

## 2019-06-14 ENCOUNTER — Encounter: Payer: Self-pay | Admitting: Sports Medicine

## 2019-06-14 ENCOUNTER — Other Ambulatory Visit: Payer: Self-pay

## 2019-06-14 DIAGNOSIS — S8991XA Unspecified injury of right lower leg, initial encounter: Secondary | ICD-10-CM

## 2019-06-14 DIAGNOSIS — M2241 Chondromalacia patellae, right knee: Secondary | ICD-10-CM | POA: Insufficient documentation

## 2019-06-14 MED ORDER — MELOXICAM 15 MG PO TABS: ORAL_TABLET | ORAL | 3 refills | Status: DC

## 2019-06-14 NOTE — Progress Notes (Signed)
Subjective:    CC: Right knee pain  HPI: Tracy Franklin is a very pleasant 48 year old female, she had a fall about 5 weeks ago, directly onto her tibial tuberosity, unfortunately has had persistent pain since then, she only had minimal swelling, pain is predominantly at the lateral joint line, with occasional mechanical symptoms.  I reviewed the past medical history, family history, social history, surgical history, and allergies today and no changes were needed.  Please see the problem list section below in epic for further details.  Past Medical History: Past Medical History:  Diagnosis Date  . Atrophic vaginitis    Past Surgical History: Past Surgical History:  Procedure Laterality Date  . ABDOMINAL HYSTERECTOMY     Social History: Social History   Socioeconomic History  . Marital status: Married    Spouse name: Not on file  . Number of children: Not on file  . Years of education: Not on file  . Highest education level: Not on file  Occupational History  . Not on file  Tobacco Use  . Smoking status: Never Smoker  . Smokeless tobacco: Never Used  Substance and Sexual Activity  . Alcohol use: Yes  . Drug use: No  . Sexual activity: Yes    Birth control/protection: Surgical  Other Topics Concern  . Not on file  Social History Narrative  . Not on file   Social Determinants of Health   Financial Resource Strain:   . Difficulty of Paying Living Expenses: Not on file  Food Insecurity:   . Worried About Charity fundraiser in the Last Year: Not on file  . Ran Out of Food in the Last Year: Not on file  Transportation Needs:   . Lack of Transportation (Medical): Not on file  . Lack of Transportation (Non-Medical): Not on file  Physical Activity:   . Days of Exercise per Week: Not on file  . Minutes of Exercise per Session: Not on file  Stress:   . Feeling of Stress : Not on file  Social Connections:   . Frequency of Communication with Friends and Family: Not on file  .  Frequency of Social Gatherings with Friends and Family: Not on file  . Attends Religious Services: Not on file  . Active Member of Clubs or Organizations: Not on file  . Attends Archivist Meetings: Not on file  . Marital Status: Not on file   Family History: Family History  Problem Relation Age of Onset  . Cancer Father        prostate  . Hyperlipidemia Father   . Early death Mother   . Asthma Mother    Allergies: No Known Allergies Medications: See med rec.  Review of Systems: No fevers, chills, night sweats, weight loss, chest pain, or shortness of breath.   Objective:    General: Well Developed, well nourished, and in no acute distress.  Neuro: Alert and oriented x3, extra-ocular muscles intact, sensation grossly intact.  HEENT: Normocephalic, atraumatic, pupils equal round reactive to light, neck supple, no masses, no lymphadenopathy, thyroid nonpalpable.  Skin: Warm and dry, no rashes. Cardiac: Regular rate and rhythm, no murmurs rubs or gallops, no lower extremity edema.  Respiratory: Clear to auscultation bilaterally. Not using accessory muscles, speaking in full sentences. Right knee: Tender at the medial and lateral joint lines, otherwise normal to inspection without erythema, effusion. ROM normal in flexion and extension and lower leg rotation. Ligaments with solid consistent endpoints including ACL, PCL, LCL, MCL. Positive McMurray  sign. Non painful patellar compression. Patellar and quadriceps tendons unremarkable. Hamstring and quadriceps strength is normal.  Impression and Recommendations:    Right knee injury There is internal derangement with positive McMurray sign, lateral joint line pain, differential includes posttraumatic osteoarthritis, meniscal tear. X-rays, MRI, meloxicam, follow-up will depend on MRI results, she will continue her knee brace.   ___________________________________________ Ihor Austin. Benjamin Stain, M.D., ABFM.,  CAQSM. Primary Care and Sports Medicine [facility]  Adjunct Professor of Family Medicine  [employer]

## 2019-06-14 NOTE — Assessment & Plan Note (Signed)
There is internal derangement with positive McMurray sign, lateral joint line pain, differential includes posttraumatic osteoarthritis, meniscal tear. X-rays, MRI, meloxicam, follow-up will depend on MRI results, she will continue her knee brace.

## 2019-06-19 ENCOUNTER — Other Ambulatory Visit: Payer: BC Managed Care – PPO

## 2019-06-20 ENCOUNTER — Telehealth: Payer: Self-pay

## 2019-06-20 DIAGNOSIS — Z8709 Personal history of other diseases of the respiratory system: Secondary | ICD-10-CM

## 2019-06-20 DIAGNOSIS — Z20822 Contact with and (suspected) exposure to covid-19: Secondary | ICD-10-CM

## 2019-06-20 NOTE — Progress Notes (Signed)
This visit occurred during the SARS-CoV-2 public health emergency.  Safety protocols were in place, including screening questions prior to the visit, additional usage of staff PPE, and extensive cleaning of exam room while observing appropriate contact time as indicated for disinfecting solutions.  Subjective:     Patient ID: Tracy Franklin , female    DOB: July 04, 1970 , 48 y.o.   MRN: [id number]   Chief Complaint  Patient presents with  . Hormone results    HPI  She is here today to go over her saliva test results. She reports compliance with progesterone supplementation. She does admit to experiencing some vaginal dryness.     Past Medical History:  Diagnosis Date  . Atrophic vaginitis      Family History  Problem Relation Age of Onset  . Cancer Father        prostate  . Hyperlipidemia Father   . Early death Mother   . Asthma Mother      Current Outpatient Medications:  .  Calcium-Magnesium 250-125 MG TABS, Take by mouth., Disp: , Rfl:  .  Cholecalciferol (VITAMIN D) 125 MCG (5000 UT) CAPS, Take 5,000 Units by mouth. , Disp: , Rfl:  .  Multiple Vitamins-Minerals (MULTIVITAMIN WITH MINERALS) tablet, Take 1 tablet by mouth daily., Disp: , Rfl:  .  Progesterone Micronized (PROGESTERONE COMPOUNDING KIT TD), Place onto the skin., Disp: , Rfl:  .  sulfamethoxazole-trimethoprim (BACTRIM DS) 800-160 MG tablet, Take 1 tablet by mouth 2 (two) times daily., Disp: , Rfl:  .  VITAMIN E EX, Apply topically., Disp: , Rfl:  .  meloxicam (MOBIC) 15 MG tablet, One tab PO qAM with a meal for 2 weeks, then daily prn pain., Disp: 30 tablet, Rfl: 3   No Known Allergies   Review of Systems  Constitutional: Negative.   Respiratory: Negative.   Cardiovascular: Negative.   Gastrointestinal: Negative.   Neurological: Negative.   Psychiatric/Behavioral: Negative.      Today's Vitals   06/01/19 1054  BP: 108/62  Pulse: 69  Temp: (!) 97.3 F (36.3 C)  TempSrc: Oral  Weight: 166 lb 3.2 oz  (75.4 kg)  Height: 5' 5" (1.651 m)   Body mass index is 27.66 kg/m.   Objective:  Physical Exam Vitals and nursing note reviewed.  Constitutional:      Appearance: Normal appearance.  HENT:     Head: Normocephalic and atraumatic.  Cardiovascular:     Rate and Rhythm: Normal rate and regular rhythm.     Heart sounds: Normal heart sounds.  Pulmonary:     Effort: Pulmonary effort is normal.     Breath sounds: Normal breath sounds.  Skin:    General: Skin is warm.  Neurological:     General: No focal deficit present.     Mental Status: She is alert.  Psychiatric:        Mood and Affect: Mood normal.        Behavior: Behavior normal.         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  Results significant for nl estrone, nl estradiol, nl estriol yet this resulted in a low estrogen quotient. She does not wish to use any estrogen products. Therefore, I will call in vitamin E suppositories for her to use vaginally as needed. She had elevated progesterone levels, which resulted in a normal PG/E2 ratio. Lastly, she had nl DHEA and  testosterone levels. All questions were answered to her satisfaction.   2. Atrophic vaginitis  Chronic, please see above.   3. Drug therapy  - CMP14+EGFR        [name], MD    THE PATIENT IS ENCOURAGED TO PRACTICE SOCIAL DISTANCING DUE TO THE COVID-19 PANDEMIC.

## 2019-06-20 NOTE — Telephone Encounter (Signed)
Tracy Franklin called and left a message stating she would like to get tested for COVID-19 antibodies. She stated she was sick earlier this year. Please advise.

## 2019-06-20 NOTE — Telephone Encounter (Signed)
Ok to order antibodies testing.

## 2019-06-21 NOTE — Telephone Encounter (Signed)
Ordered labs

## 2019-06-22 LAB — SARS COV-2 SEROLOGY(COVID-19)AB(IGG,IGM),IMMUNOASSAY
SARS CoV-2 AB IgG: NEGATIVE
SARS CoV-2 IgM: NEGATIVE

## 2019-06-22 NOTE — Telephone Encounter (Signed)
No antibodies to covid.

## 2019-06-25 ENCOUNTER — Other Ambulatory Visit: Payer: Self-pay

## 2019-06-25 ENCOUNTER — Ambulatory Visit (INDEPENDENT_AMBULATORY_CARE_PROVIDER_SITE_OTHER): Payer: BC Managed Care – PPO

## 2019-06-25 DIAGNOSIS — S8991XD Unspecified injury of right lower leg, subsequent encounter: Secondary | ICD-10-CM

## 2019-06-25 DIAGNOSIS — W19XXXD Unspecified fall, subsequent encounter: Secondary | ICD-10-CM | POA: Diagnosis not present

## 2019-06-28 ENCOUNTER — Encounter: Payer: Self-pay | Admitting: Internal Medicine

## 2019-07-04 ENCOUNTER — Other Ambulatory Visit: Payer: Self-pay

## 2019-07-04 ENCOUNTER — Ambulatory Visit (INDEPENDENT_AMBULATORY_CARE_PROVIDER_SITE_OTHER): Payer: BC Managed Care – PPO | Admitting: Sports Medicine

## 2019-07-04 DIAGNOSIS — G8929 Other chronic pain: Secondary | ICD-10-CM | POA: Insufficient documentation

## 2019-07-04 DIAGNOSIS — M25512 Pain in left shoulder: Secondary | ICD-10-CM

## 2019-07-04 DIAGNOSIS — M2241 Chondromalacia patellae, right knee: Secondary | ICD-10-CM

## 2019-07-04 NOTE — Assessment & Plan Note (Signed)
Tracy Franklin returns, she has improved with using NSAIDs initially, she has since discontinued, she has increased her exercising, mostly low impact and pain continues to get better. We will avoid injections, further oral anti-inflammatories but I would like her to do a session of therapy to learn good form for patellofemoral rehabilitation.

## 2019-07-04 NOTE — Assessment & Plan Note (Signed)
Left shoulder pain for many months now, localized posteriorly and worse with internal rotation such as when putting on her bra strap. 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 We are going to have her do a single session of rotator cuff rehabilitation to learn an HEP, and then I would like to see her back in about 6 weeks.

## 2019-07-04 NOTE — Progress Notes (Signed)
    Procedures performed today:    None.  Independent interpretation of tests performed by another provider:   I did personally review her knee MRI, she has a focal areas of cartilage loss under the patella.  Impression and Recommendations:    Chondromalacia of patellofemoral joint, right Georganna returns, she has improved with using NSAIDs initially, she has since discontinued, she has increased her exercising, mostly low impact and pain continues to get better. We will avoid injections, further oral anti-inflammatories but I would like her to do a session of therapy to learn good form for patellofemoral rehabilitation.  Chronic left shoulder pain Left shoulder pain for many months now, localized posteriorly and worse with internal rotation such as when putting on her bra strap. 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 We are going to have her do a single session of rotator cuff rehabilitation to learn an HEP, and then I would like to see her back in about 6 weeks.    ___________________________________________ Ihor Austin. Benjamin Stain, M.D., ABFM., CAQSM. Primary Care and Sports Medicine [facility]  Adjunct Instructor of Family Medicine  [employer]

## 2019-07-12 ENCOUNTER — Ambulatory Visit: Payer: BC Managed Care – PPO | Admitting: Rehabilitative and Restorative Service Providers"

## 2019-07-12 ENCOUNTER — Other Ambulatory Visit: Payer: Self-pay

## 2019-07-12 DIAGNOSIS — M25561 Pain in right knee: Secondary | ICD-10-CM

## 2019-07-12 DIAGNOSIS — M25512 Pain in left shoulder: Secondary | ICD-10-CM | POA: Diagnosis not present

## 2019-07-12 DIAGNOSIS — M6281 Muscle weakness (generalized): Secondary | ICD-10-CM

## 2019-07-12 NOTE — Therapy (Signed)
[place], [place], [address] Phone: [phone]   Fax:  [phone]  Physical Therapy Evaluation  Patient Details  Name: Tracy Franklin MRN: [id number] Date of Birth: Jan 18, 1971 Referring Provider (PT): Silverio Decamp, MD   Encounter Date: 07/12/2019  PT End of Session - 07/12/19 0911    Visit Number  1    Number of Visits  4    Date for PT Re-Evaluation  08/26/19    PT Start Time  0803    PT Stop Time  0902    PT Time Calculation (min)  59 min       Past Medical History:  Diagnosis Date  . Atrophic vaginitis     Past Surgical History:  Procedure Laterality Date  . ABDOMINAL HYSTERECTOMY      There were no vitals filed for this visit.   Subjective Assessment - 07/12/19 0804    Subjective  The patient reports R knee pain onset in November 2020 from a fall (stepped over a slippery area on the floor).  She had immediate onset of pain, no swelling and iced to manage pain.  The point of contact for the floor was proximal tibia.  She had pain when she returned to walking.  She gets stiffness in her knee early in the morning and when sitting for  long periods of time.  She has worn a support brace (as needed based on pain).  L SHOULDER:  Gradual onset of L shoulder pain.  The most limiting motion is internal rotation to hook her bra strap (sharp pain).  She reports she cannot ER the L shoulder as compared to the R shoulder "like a sharp pinch".    Patient Stated Goals  Learn exercises to manage R knee and L shoulder    Currently in Pain?  Yes    Pain Score  --   "uncomfortable, never excrutciating"   Pain Location  Knee    Pain Orientation  Right    Pain Radiating Towards  feels "weakness" on the lateral aspect of the R knee    Pain Onset  More than a month ago    Pain Frequency  Intermittent    Aggravating Factors   sitting for long periods    Pain Relieving Factors  exercise    Multiple Pain Sites  Yes    Pain Score  1   aching   Pain Location  Shoulder    Pain Orientation  Left    Pain Descriptors / Indicators  Aching    Pain Type  Chronic pain    Pain Onset  More than a month ago    Pain Frequency  Intermittent    Aggravating Factors   IR to hook bra and ER reaching hands behind head    Pain Relieving Factors  at rest         [facility] PT Assessment - 07/12/19 0813      Assessment   Medical Diagnosis  chondromalacia of patellofemoral joint (right),  impingement syndrome L shoulder    Referring Provider (PT)  Silverio Decamp, MD    Onset Date/Surgical Date  --   05/2019   Hand Dominance  Left   uses R for other activities except writing     Precautions   Precautions  None      Restrictions   Weight Bearing Restrictions  No      Balance Screen   Has the patient fallen in the  past 6 months  No    Has the patient had a decrease in activity level because of a fear of falling?   No    Is the patient reluctant to leave their home because of a fear of falling?   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      Observation/Other Assessments   Focus on Therapeutic Outcomes (FOTO)   deferred due to HEP prescription      Sensation   Light Touch  Appears Intact      ROM / Strength   AROM / PROM / Strength  AROM;Strength      AROM   AROM Assessment Site  Knee;Shoulder    Right/Left Shoulder  Left;Right    Right Shoulder Flexion  160 Degrees    Right Shoulder Internal Rotation  80 Degrees    Right Shoulder External Rotation  90 Degrees    Left Shoulder Flexion  160 Degrees   no pain   Left Shoulder Internal Rotation  45 Degrees    Left Shoulder External Rotation  80 Degrees   6/10 pain at end range down to elbow   Left Shoulder Horizontal ADduction  35 Degrees   pain in posterior  aspect    Right/Left Knee  --   WNLs     Strength   Strength Assessment Site  Hip;Knee;Ankle;Shoulder;Elbow    Right/Left Shoulder  Left    Left Shoulder Flexion  5/5    Left Shoulder ABduction  3/5   with pain   Left Shoulder Internal Rotation  4/5    Left Shoulder External Rotation  3/5   pain   Right/Left Hip  Right;Left    Right Hip Flexion  5/5    Left Hip Flexion  5/5    Right/Left Knee  Right;Left    Right Knee Flexion  4/5    Right Knee Extension  5/5    Left Knee Flexion  5/5    Left Knee Extension  5/5    Right/Left Ankle  Right;Left    Right Ankle Dorsiflexion  5/5    Left Ankle Dorsiflexion  5/5      Palpation   Palpation comment  KNEE:  pain over lateral R knee at lateral hamstring insertion and over distal IT Band  SHOULDER:  pain over  subscapularis, posterior musculature.                Objective measurements completed on examination: See above findings.      [facility] Adult PT Treatment/Exercise - 07/12/19 0908      Exercises   Exercises  Knee/Hip;Shoulder      Knee/Hip Exercises: Stretches   Active Hamstring Stretch  2 reps;30 seconds    ITB Stretch  2 reps    ITB Stretch Limitations  IT band rolling proximal and distal      Knee/Hip Exercises: Seated   Hamstring Curl  Right;Strengthening;10 reps     Hamstring Weights  --   red band for home     Shoulder Exercises: Stretch   Other Shoulder Stretches  sleeper + IR stretch sidelying on L side    Other Shoulder Stretches  Tennis ball roll of subscapularis in L sidelying and standing.  PT Education - 07/12/19 0910    Education Details  HEP    Person(s) Educated  Patient    Methods  Explanation;Demonstration;Handout    Comprehension  Verbalized understanding;Returned demonstration          PT Long Term Goals - 07/12/19 0911      PT LONG TERM GOAL #1   Title  The patient will return demo HEP for R knee and L shoulder for self mgmt of symptoms.    Time  6     Period  Weeks    Target Date  08/26/19             Plan - 07/12/19 0900    Clinical Impression Statement  The patient is a 49 year old female presenting to OP rehab with chronic h/o L shoulder impingement pain and onset of R knee pain after a slip in her kitchen in 04/2019.  She was referred for HEP instruction due to high copay.  At today's visit, PT was able to assess both the L shoulder, R knee, and initiate HEP.  She has significant tightness bilat IT band, pain with resisted R knee flexion lateral hamstring, and pain with palpation of R lateral hamstring.  For her shoulder, resisted abduction, resisted empty can and end range ER and IR elicit pain.  She has significant muscle tightness L subscapularis.  We plan to f/u for 1-2 more visits based on need to perform dry needling for trigger point release, progression of HEP, and patient education for self mgmt.    Personal Factors and Comorbidities  Other   patient active and participates in regular exercise enhancing her outcome   Examination-Activity Limitations  Dressing   unable to hook bra strap at end range IR   Stability/Clinical Decision Making  Stable/Uncomplicated    Clinical Decision Making  Low    Rehab Potential  Good    PT Frequency  1x / week    PT Duration  4 weeks    PT Treatment/Interventions  ADLs/Self Care Home Management;Taping;Dry needling;Patient/family education;Therapeutic activities;Therapeutic exercise;Cryotherapy;Iontophoresis 4mg /ml Dexamethasone;Manual techniques    PT Next Visit Plan  Dry needling, progress HEP (if tolerating current HEP).  Anticipate needing further R hamstring strengthening for HEP, L shoulder deltoid (begin with isometrics and progress), L shoulder IR/ER at neutral with band, and functional strengthening for return to working out (planks modified to be on wall if needed).  Add further shoulder stretching as indicated.    PT Home Exercise Plan  Access Code: [email or web address]    Consulted and Agree  with Plan of Care  Patient       Patient will benefit from skilled therapeutic intervention in order to improve the following deficits and impairments:  Pain, Decreased range of motion, Decreased strength, Impaired flexibility, Increased fascial restricitons  Visit Diagnosis: Acute pain of right knee  Left shoulder pain, unspecified chronicity  Muscle weakness (generalized)     Problem List Patient Active Problem List   Diagnosis Date Noted  . Chronic left shoulder pain 07/04/2019  . Chondromalacia of patellofemoral joint, 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  . Epigastric pain 06/27/2018  . Nausea 06/27/2018  . Right upper quadrant pain 06/27/2018  . Breast hypertrophy in female 09/30/2017  . Muscle spasm 09/30/2017  . Upper back pain 09/30/2017  . Encounter for screening mammogram for malignant neoplasm of breast 04/09/2015  . Retrocalcaneal bursitis 11/02/2014  . Facet arthritis of lumbar region 11/02/2014  . Menopausal symptoms 05/07/2014  . SI (sacroiliac) joint dysfunction 03/26/2014  Coston Mandato, PT 07/12/2019, 9:12 AM  [facility] [address] [place], [place], [address] Phone: [phone]   Fax:  [phone]  Name: Tracy Franklin MRN: [id number] Date of Birth: Oct 02, 1970

## 2019-07-12 NOTE — Patient Instructions (Signed)
Access Code: [email or web address]  URL: [email or web address]  Date: 07/12/2019  Prepared by: Margretta Ditty   Exercises Seated Hamstring Curl with Anchored Resistance - 10 reps - 2 sets - 3 seconds hold - 2x daily - 7x weekly Sidelying IT Band Foam Roll Mobilization - 1 reps - 1 sets - 2x daily - 7x weekly Sleeper Stretch - 10 reps - 1 sets - 2x daily - 7x weekly Seated Hamstring Stretch with Chair - 3 reps - 1 sets - 30 seconds hold - 2x daily - 7x weekly Lacrosse Ball Subscapularis Release - 1 reps - 1 sets - 2x daily - 7x weekly Patient Education Ice Massage

## 2019-07-13 ENCOUNTER — Encounter: Payer: Self-pay | Admitting: Internal Medicine

## 2019-07-13 ENCOUNTER — Telehealth (INDEPENDENT_AMBULATORY_CARE_PROVIDER_SITE_OTHER): Payer: BC Managed Care – PPO | Admitting: Internal Medicine

## 2019-07-13 VITALS — BP 120/76

## 2019-07-13 DIAGNOSIS — Z79899 Other long term (current) drug therapy: Secondary | ICD-10-CM

## 2019-07-13 DIAGNOSIS — N951 Menopausal and female climacteric states: Secondary | ICD-10-CM | POA: Diagnosis not present

## 2019-07-13 NOTE — Progress Notes (Signed)
Virtual Visit via Video   This visit type was conducted due to national recommendations for restrictions regarding the COVID-19 Pandemic (e.g. social distancing) in an effort to limit this patient's exposure and mitigate transmission in our community.  Due to her co-morbid illnesses, this patient is at least at moderate risk for complications without adequate follow up.  This format is felt to be most appropriate for this patient at this time.  All issues noted in this document were discussed and addressed.  A limited physical exam was performed with this format.    This visit type was conducted due to national recommendations for restrictions regarding the COVID-19 Pandemic (e.g. social distancing) in an effort to limit this patient's exposure and mitigate transmission in our community.  Patients identity confirmed using two different identifiers.  This format is felt to be most appropriate for this patient at this time.  All issues noted in this document were discussed and addressed.  No physical exam was performed (except for noted visual exam findings with Video Visits).    Date:  07/13/2019   ID:  Tracy Franklin, DOB 1970/10/07, MRN [id number]  Patient Location:  Home  Provider location:   Office    Chief Complaint:  "I have f/u BHRT"  History of Present Illness:    Tracy Franklin is a 49 y.o. female who presents via video conferencing for a telehealth visit today.    The patient does not have symptoms concerning for COVID-19 infection (fever, chills, cough, or new shortness of breath).   She presents today for virtual visit. She prefers this method of contact due to COVID-19 pandemic. She presents for f/u progesterone. She has been doing decreased dose of progesterone. She feels well on her current regimen. She has not had any increase in hot flashes or change in her sleep quality. She has no new concerns at this time.     Past Medical History:  Diagnosis Date  . Atrophic  vaginitis    Past Surgical History:  Procedure Laterality Date  . ABDOMINAL HYSTERECTOMY       Current Meds  Medication Sig  . Calcium-Magnesium 250-125 MG TABS Take by mouth.  . Cholecalciferol (VITAMIN D) 125 MCG (5000 UT) CAPS Take 5,000 Units by mouth.   . meloxicam (MOBIC) 15 MG tablet One tab PO qAM with a meal for 2 weeks, then daily prn pain.  . Multiple Vitamins-Minerals (MULTIVITAMIN WITH MINERALS) tablet Take 1 tablet by mouth daily.  . Progesterone Micronized (PROGESTERONE COMPOUNDING KIT TD) Place onto the skin.  Marland Kitchen VITAMIN E EX Apply topically.     Allergies:   Patient has no known allergies.   Social History   Tobacco Use  . Smoking status: Never Smoker  . Smokeless tobacco: Never Used  Substance Use Topics  . Alcohol use: Yes  . Drug use: No     Family Hx: The patient's family history includes Asthma in her mother; Cancer in her father; Early death in her mother; Hyperlipidemia in her father.  ROS:   Please see the history of present illness.    Review of Systems  Constitutional: Negative.   Respiratory: Negative.   Cardiovascular: Negative.   Gastrointestinal: Negative.   Neurological: Negative.   Psychiatric/Behavioral: Negative.     All other systems reviewed and are negative.   Labs/Other Tests and Data Reviewed:    Recent Labs: 06/01/2019: ALT 19; BUN 15; Creatinine, Ser 1.03; Potassium 4.8; Sodium 139   Recent Lipid Panel Lab Results  Component  Value Date/Time   CHOL 216 (H) 12/07/2017 10:34 AM   TRIG 68 12/07/2017 10:34 AM   HDL 80 12/07/2017 10:34 AM   CHOLHDL 2.7 12/07/2017 10:34 AM   LDLCALC 120 (H) 12/07/2017 10:34 AM    Wt Readings from Last 3 Encounters:  06/14/19 166 lb (75.3 kg)  06/01/19 166 lb 3.2 oz (75.4 kg)  11/29/18 161 lb 6.4 oz (73.2 kg)     Exam:    Vital Signs:  BP 120/76     Physical Exam  Constitutional: She is oriented to person, place, and time and well-developed, well-nourished, and in no distress.    HENT:  Head: Normocephalic and atraumatic.  Pulmonary/Chest: Effort normal.  Musculoskeletal:     Cervical back: Normal range of motion.  Neurological: She is alert and oriented to person, place, and time.  Psychiatric: Affect normal.  Nursing note and vitals reviewed.   ASSESSMENT & PLAN:    1. Female climacteric state  She will continue with progesterone 1/4cc nightly except Sundays. I will call in refill into [facility]. She agrees to rto in four months for re-evaluation. All questions were answered to her satisfaction. She prefers virtual visit at this time. She is due for mammogram July 2021.   2. Drug therapy       COVID-19 Education: The signs and symptoms of COVID-19 were discussed with the patient and how to seek care for testing (follow up with PCP or arrange E-visit).  The importance of social distancing was discussed today.  Patient Risk:   After full review of this patients clinical status, I feel that they are at least moderate risk at this time.  Medication Adjustments/Labs and Tests Ordered: Current medicines are reviewed at length with the patient today.  Concerns regarding medicines are outlined above.   Tests Ordered: No orders of the defined types were placed in this encounter.   Medication Changes: No orders of the defined types were placed in this encounter.   Disposition:  Follow up in 4 month(s)  Signed, [name], MD

## 2019-07-13 NOTE — Patient Instructions (Signed)
COVID-19 Vaccine Information can be found at: [email or web address] For questions related to vaccine distribution or appointments, please email [email or web address] or call [phone].    

## 2019-07-27 ENCOUNTER — Encounter: Payer: BC Managed Care – PPO | Admitting: Physical Therapy

## 2019-07-31 ENCOUNTER — Ambulatory Visit: Payer: BC Managed Care – PPO | Admitting: Physical Therapy

## 2019-07-31 ENCOUNTER — Encounter: Payer: Self-pay | Admitting: Physical Therapy

## 2019-07-31 ENCOUNTER — Other Ambulatory Visit: Payer: Self-pay

## 2019-07-31 DIAGNOSIS — M25512 Pain in left shoulder: Secondary | ICD-10-CM | POA: Diagnosis not present

## 2019-07-31 DIAGNOSIS — M6281 Muscle weakness (generalized): Secondary | ICD-10-CM

## 2019-07-31 DIAGNOSIS — M25561 Pain in right knee: Secondary | ICD-10-CM

## 2019-07-31 NOTE — Therapy (Addendum)
[place], [place], [address] Phone: [phone]   Fax:  [phone]  Physical Therapy Treatment  Patient Details  Name: Tracy Franklin MRN: [id number] Date of Birth: 1971-03-23 Referring Provider (PT): Silverio Decamp, MD   Encounter Date: 07/31/2019  PT End of Session - 07/31/19 0800    Visit Number  2    Number of Visits  4    Date for PT Re-Evaluation  08/26/19    PT Start Time  0800    PT Stop Time  0848    PT Time Calculation (min)  48 min    Activity Tolerance  Patient tolerated treatment well    Behavior During Therapy  [facility] for tasks assessed/performed       Past Medical History:  Diagnosis Date  . Atrophic vaginitis     Past Surgical History:  Procedure Laterality Date  . ABDOMINAL HYSTERECTOMY      There were no vitals filed for this visit.  Subjective Assessment - 07/31/19 0801    Subjective  The shoulder does not seem to be improving. The knee is doing better overall. Using foam roller on ITB is very painful.    Patient Stated Goals  Learn exercises to manage R knee and L shoulder    Currently in Pain?  Yes    Pain Score  2     Pain Location  Knee    Pain Orientation  Right    Pain Score  5    Pain Location  Shoulder    Pain Orientation  Left    Pain Descriptors / Indicators  Aching                       [facility] Adult PT Treatment/Exercise - 07/31/19 0001      Knee/Hip Exercises: Stretches   Active Hamstring Stretch  Both;1 rep;20 seconds    ITB Stretch  Right;2 reps;30 seconds      Knee/Hip Exercises: Supine   Bridges with Clamshell  Strengthening;Both;1 set;10 reps   with alt knee ext; cues for level hips     Knee/Hip Exercises: Prone   Other Prone Exercises  plank and plank on knees x 10 sec      Shoulder Exercises: Isometric Strengthening   External Rotation  5X10"    Internal Rotation  5X10"    ABduction  5X10"    ABduction  Limitations  elbow bent      Shoulder Exercises: Stretch   Other Shoulder Stretches  sleeper + IR stretch sidelying on L side    Other Shoulder Stretches  doorway stretch       Manual Therapy   Manual Therapy  Soft tissue mobilization    Manual therapy comments  skilled palpation and monitoring of soft tissue during DN         Trigger Point Dry Needling - 07/31/19 0001    Consent Given?  Yes    Education Handout Provided  Yes    Muscles Treated Head and Neck  Upper trapezius    Muscles Treated Upper Quadrant  Pectoralis major;Infraspinatus;Subscapularis;Deltoid;Latissimus dorsi;Teres major;Teres minor    Muscles Treated Lower Quadrant  Vastus lateralis;Hamstring    Dry Needling Comments  left shoulder; RLE    Upper Trapezius Response  Twitch reponse elicited;Palpable increased muscle length    Pectoralis Major Response  Twitch response elicited;Palpable increased muscle length    Infraspinatus Response  Twitch response elicited;Palpable increased muscle length  Subscapularis Response  Twitch response elicited;Palpable increased muscle length    Deltoid Response  Twitch response elicited;Palpable increased muscle length    Latissimus dorsi Response  Twitch response elicited;Palpable increased muscle length    Teres major Response  Twitch response elicited;Palpable increased muscle length    Teres minor Response  Twitch response elicited;Palpable increased muscle length    Vastus lateralis Response  Twitch response elicited;Palpable increased muscle length    Hamstring Response  Twitch response elicited;Palpable increased muscle length           PT Education - 07/31/19 1012    Education Details  HEP    Person(s) Educated  Patient    Methods  Explanation;Demonstration;Handout    Comprehension  Verbalized understanding;Returned demonstration          PT Long Term Goals - 07/12/19 0911      PT LONG TERM GOAL #1   Title  The patient will return demo HEP for R knee and L  shoulder for self mgmt of symptoms.    Time  6    Period  Weeks    Target Date  08/26/19            Plan - 07/31/19 1012    Clinical Impression Statement  Patient presents with continued c/o left shoulder and right knee pain with improvements in knee. She reports compliance with HEP. Isometrics for the shoulder were added. Patient had some pain and advised to perform without pain. Great response to DN in left UQ and right lateral quads especially left subscapularis and lateral periscapular muscles. LTGs are ongoing.    PT Frequency  1x / week    PT Duration  4 weeks    PT Treatment/Interventions  ADLs/Self Care Home Management;Taping;Dry needling;Patient/family education;Therapeutic activities;Therapeutic exercise;Cryotherapy;Iontophoresis 94m/ml Dexamethasone;Manual techniques    PT Next Visit Plan  Assess Dry needling and continue as indicated, progress HEP (if tolerating current HEP).  Anticipate needing further R hamstring strengthening for HEP, L shoulder deltoid (begin with isometrics and progress), L shoulder IR/ER at neutral with band, and functional strengthening for return to working out (planks modified to be on wall if needed).  Add further shoulder stretching as indicated.    PT Home Exercise Plan  Access Code: [email or web address]       Patient will benefit from skilled therapeutic intervention in order to improve the following deficits and impairments:  Pain, Decreased range of motion, Decreased strength, Impaired flexibility, Increased fascial restricitons  Visit Diagnosis: Acute pain of right knee  Left shoulder pain, unspecified chronicity  Muscle weakness (generalized)     Problem List Patient Active Problem List   Diagnosis Date Noted  . Chronic left shoulder pain 07/04/2019  . Chondromalacia of patellofemoral joint, 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  . Epigastric pain 06/27/2018  . Nausea 06/27/2018  . Right upper quadrant pain 06/27/2018  . Breast hypertrophy in female 09/30/2017   . Muscle spasm 09/30/2017  . Upper back pain 09/30/2017  . Encounter for screening mammogram for malignant neoplasm of breast 04/09/2015  . Retrocalcaneal bursitis 11/02/2014  . Facet arthritis of lumbar region 11/02/2014  . Menopausal symptoms 05/07/2014  . SI (sacroiliac) joint dysfunction 03/26/2014    JMadelyn FlavorsPT 07/31/2019, 1:12 PM  [facility] [address]Phone: [phone]  Fax:  [phone] Name: TShade RivenbarkMRN: [id number] of Birth: 927-Sep-1972 PHYSICAL THERAPY DISCHARGE SUMMARY  Visits from Start of Care: 2  Current functional level related  to goals / functional outcomes: See above   Remaining deficits: See above   Education / Equipment: HEP  Plan: Patient agrees to discharge.  Patient goals were not met. Patient is being discharged due to not returning since the last visit.  ?????    Madelyn Flavors, PT 12/20/19 5:12 PM  [facility] [place] [address] [place], [place] [address]  [phone] (office) [phone] (fax)

## 2019-07-31 NOTE — Patient Instructions (Signed)
Access Code: [email or web address]  URL: [email or web address]  Date: 07/31/2019  Prepared by: Raynelle Fanning Criss Bartles   Exercises Seated Hamstring Curl with Anchored Resistance - 10 reps - 2 sets - 3 seconds hold - 2x daily - 7x weekly Sidelying IT Band Foam Roll Mobilization - 1 reps - 1 sets - 2x daily - 7x weekly Sleeper Stretch - 10 reps - 1 sets - 2x daily - 7x weekly Seated Hamstring Stretch with Chair - 3 reps - 1 sets - 30 seconds hold - 2x daily - 7x weekly Lacrosse Ball Subscapularis Release - 1 reps - 1 sets - 2x daily - 7x weekly Supine Bridge with Knee Extension and Pelvic Floor Contraction - 10 reps - 3 sets - 1x daily - 7x weekly Supine Single Knee to Chest Stretch - 5 reps - 1 sets - 20 sec hold                            - 1x daily - 7x weekly Standard Plank - 5 reps - 1 sets - max hold - 2x daily - 7x weekly Standing Isometric Shoulder Abduction with Doorway - Arm Bent - 5 reps - 3 sets - 10 sec hold - 1x daily - 7x weekly Standing Isometric Shoulder Internal Rotation at Doorway - 5 reps - 1 sets - 10 sec hold - 1x daily - 7x weekly Isometric Shoulder External Rotation at Wall - 5 reps - 1 sets - 10 sec hold - 1x daily - 7x weekly Patient Education Ice Massage Trigger Point Dry Needling

## 2019-08-14 ENCOUNTER — Ambulatory Visit (INDEPENDENT_AMBULATORY_CARE_PROVIDER_SITE_OTHER): Payer: BC Managed Care – PPO | Admitting: Sports Medicine

## 2019-08-14 DIAGNOSIS — M2241 Chondromalacia patellae, right knee: Secondary | ICD-10-CM

## 2019-08-14 DIAGNOSIS — G8929 Other chronic pain: Secondary | ICD-10-CM

## 2019-08-14 DIAGNOSIS — M25512 Pain in left shoulder: Secondary | ICD-10-CM

## 2019-08-14 NOTE — Progress Notes (Signed)
   Virtual Visit via WebEx/MyChart   I connected with  Tracy Franklin  on 08/14/19 via WebEx/MyChart/Doximity Video and verified that I am speaking with the correct person using two identifiers.   I discussed the limitations, risks, security and privacy concerns of performing an evaluation and management service by WebEx/MyChart/Doximity Video, including the higher likelihood of inaccurate diagnosis and treatment, and the availability of in person appointments.  We also discussed the likely need of an additional face to face encounter for complete and high quality delivery of care.  I also discussed with the patient that there may be a patient responsible charge related to this service. The patient expressed understanding and wishes to proceed.  Provider location is either at home or medical facility. Patient location is at their home, different from provider location. People involved in care of the patient during this telehealth encounter were myself, my nurse/medical assistant, and my front office/scheduling team member.  Review of Systems: No fevers, chills, night sweats, weight loss, chest pain, or shortness of breath.   Objective Findings:    General: Speaking full sentences, no audible heavy breathing.  Sounds alert and appropriately interactive.  Appears well.  Face symmetric.  Extraocular movements intact.  Pupils equal and round.  No nasal flaring or accessory muscle use visualized.  Independent interpretation of tests performed by another provider:   None.  Impression and Recommendations:    Chondromalacia of patellofemoral joint, right Tracy Franklin has now done physical therapy for approximately 6 weeks, she did have patellofemoral chondromalacia with areas of full-thickness cartilage loss on her MRI. She has improved considerably but still has some discomfort with kneeling and deep flexion. She feels though she is continuing to improve and would like to give therapy at least another  couple weeks before considering injection.  Chronic left shoulder pain Tracy Franklin has also had pain in her left shoulder, localized posteriorly and worse with internal rotation such as when putting on her bra strap. This is consistent with impingement syndrome, she has also continued to improve with physical therapy but she is doing some home improvement necessitating a significant amount of painting. In a couple of weeks if she is not doing better we will do a subacromial injection. She will make a follow-up as needed.   I discussed the above assessment and treatment plan with the patient. The patient was provided an opportunity to ask questions and all were answered. The patient agreed with the plan and demonstrated an understanding of the instructions.   The patient was advised to call back or seek an in-person evaluation if the symptoms worsen or if the condition fails to improve as anticipated.   I provided 30 minutes of face to face and non-face-to-face time during this encounter date, time was needed to gather information, review chart, records, communicate/coordinate with staff remotely, as well as complete documentation.   ___________________________________________ Ihor Austin. Benjamin Stain, M.D., ABFM., CAQSM. Primary Care and Sports Medicine [facility]  Adjunct Instructor of Family Medicine  [employer]

## 2019-08-14 NOTE — Assessment & Plan Note (Signed)
Tracy Franklin has now done physical therapy for approximately 6 weeks, she did have patellofemoral chondromalacia with areas of full-thickness cartilage loss on her MRI. She has improved considerably but still has some discomfort with kneeling and deep flexion. She feels though she is continuing to improve and would like to give therapy at least another couple weeks before considering injection.

## 2019-08-14 NOTE — Assessment & Plan Note (Signed)
Tracy Franklin has also had pain in her left shoulder, localized posteriorly and worse with internal rotation such as when putting on her bra strap. This is consistent with impingement syndrome, she has also continued to improve with physical therapy but she is doing some home improvement necessitating a significant amount of painting. In a couple of weeks if she is not doing better we will do a subacromial injection. She will make a follow-up as needed.

## 2019-10-27 ENCOUNTER — Ambulatory Visit: Payer: BC Managed Care – PPO | Admitting: Sports Medicine

## 2019-10-27 ENCOUNTER — Other Ambulatory Visit: Payer: Self-pay

## 2019-10-27 ENCOUNTER — Ambulatory Visit (INDEPENDENT_AMBULATORY_CARE_PROVIDER_SITE_OTHER): Payer: BC Managed Care – PPO

## 2019-10-27 DIAGNOSIS — G8929 Other chronic pain: Secondary | ICD-10-CM

## 2019-10-27 DIAGNOSIS — M25512 Pain in left shoulder: Secondary | ICD-10-CM | POA: Diagnosis not present

## 2019-10-27 NOTE — Progress Notes (Signed)
    Procedures performed today:    Procedure: Real-time Ultrasound Guided injection of the left glenohumeral joint Device: Samsung HS60  Verbal informed consent obtained.  Time-out conducted.  Noted no overlying erythema, induration, or other signs of local infection.  Skin prepped in a sterile fashion.  Local anesthesia: Topical Ethyl chloride.  With sterile technique and under real time ultrasound guidance: 1 cc Kenalog 40, 2 cc lidocaine, 2 cc bupivacaine injected easily Completed without difficulty  Pain immediately resolved suggesting accurate placement of the medication.  Advised to call if fevers/chills, erythema, induration, drainage, or persistent bleeding.  Images permanently stored and available for review in the ultrasound unit.  Impression: Technically successful ultrasound guided injection.  Procedure: Real-time Ultrasound Guided injection of the left subacromial bursa Device: Samsung HS60  Verbal informed consent obtained.  Time-out conducted.  Noted no overlying erythema, induration, or other signs of local infection.  Skin prepped in a sterile fashion.  Local anesthesia: Topical Ethyl chloride.  With sterile technique and under real time ultrasound guidance: 1 cc Kenalog 40, 1 cc lidocaine, 1 cc bupivacaine injected easily Completed without difficulty  Pain immediately resolved suggesting accurate placement of the medication.  Advised to call if fevers/chills, erythema, induration, drainage, or persistent bleeding.  Images permanently stored and available for review in the ultrasound unit.  Impression: Technically successful ultrasound guided injection.  Independent interpretation of notes and tests performed by another provider:   None.  Brief History, Exam, Impression, and Recommendations:    Chronic left shoulder pain This is a very pleasant 49 year old female, I saw her back in February, we treated her for shoulder pain, clinically at the time it was  impingement in nature. She did not respond to conservative treatment and continues to have discomfort, today she has positive Neer, Hawkins, empty can signs, as well as a positive crank test and O'Brien's test consistent with both rotator cuff and glenohumeral/labral pathology. Today I performed a glenohumeral and subacromial injections, we are going to get some x-rays, return to see me in 1 month.    ___________________________________________ Tracy Franklin. Tracy Franklin, M.D., ABFM., CAQSM. Primary Care and Sports Medicine [facility]  Adjunct Instructor of Family Medicine  [employer]

## 2019-10-27 NOTE — Assessment & Plan Note (Signed)
This is a very pleasant 49 year old female, I saw her back in February, we treated her for shoulder pain, clinically at the time it was impingement in nature. She did not respond to conservative treatment and continues to have discomfort, today she has positive Neer, Hawkins, empty can signs, as well as a positive crank test and O'Brien's test consistent with both rotator cuff and glenohumeral/labral pathology. Today I performed a glenohumeral and subacromial injections, we are going to get some x-rays, return to see me in 1 month.

## 2019-11-13 ENCOUNTER — Telehealth (INDEPENDENT_AMBULATORY_CARE_PROVIDER_SITE_OTHER): Payer: BC Managed Care – PPO | Admitting: Internal Medicine

## 2019-11-13 ENCOUNTER — Encounter: Payer: Self-pay | Admitting: Internal Medicine

## 2019-11-13 ENCOUNTER — Other Ambulatory Visit: Payer: Self-pay | Admitting: *Deleted

## 2019-11-13 ENCOUNTER — Telehealth: Payer: Self-pay

## 2019-11-13 VITALS — BP 116/77 | HR 55

## 2019-11-13 DIAGNOSIS — N951 Menopausal and female climacteric states: Secondary | ICD-10-CM | POA: Diagnosis not present

## 2019-11-13 DIAGNOSIS — S8991XA Unspecified injury of right lower leg, initial encounter: Secondary | ICD-10-CM

## 2019-11-13 DIAGNOSIS — F5101 Primary insomnia: Secondary | ICD-10-CM | POA: Diagnosis not present

## 2019-11-13 MED ORDER — MELOXICAM 15 MG PO TABS: ORAL_TABLET | ORAL | 3 refills | Status: DC

## 2019-11-13 NOTE — Patient Instructions (Signed)

## 2019-11-13 NOTE — Progress Notes (Signed)
Virtual Visit via Video   This visit type was conducted due to national recommendations for restrictions regarding the COVID-19 Pandemic (e.g. social distancing) in an effort to limit this patient's exposure and mitigate transmission in our community.  Due to her co-morbid illnesses, this patient is at least at moderate risk for complications without adequate follow up.  This format is felt to be most appropriate for this patient at this time.  All issues noted in this document were discussed and addressed.  A limited physical exam was performed with this format.    This visit type was conducted due to national recommendations for restrictions regarding the COVID-19 Pandemic (e.g. social distancing) in an effort to limit this patient's exposure and mitigate transmission in our community.  Patients identity confirmed using two different identifiers.  This format is felt to be most appropriate for this patient at this time.  All issues noted in this document were discussed and addressed.  No physical exam was performed (except for noted visual exam findings with Video Visits).    Date:  11/13/2019   ID:  Tracy Franklin, DOB [birth year], MRN [id number]  Patient Location:  Home  Provider location:   Office    Chief Complaint:  "I have BHRT f/u"  History of Present Illness:    Tracy Franklin is a 49 y.o. female who presents via video conferencing for a telehealth visit today.    The patient does not have symptoms concerning for COVID-19 infection (fever, chills, cough, or new shortness of breath).   She presents today for virtual visit. She prefers this method of contact due to COVID-19 pandemic.  She presents today for BHRT f/u.  She feels well on her current regimen. She has been using progesterone nightly except Sundays. She has no issues with her regimen.     Past Medical History:  Diagnosis Date  . Atrophic vaginitis    Past Surgical History:  Procedure Laterality Date  . ABDOMINAL  HYSTERECTOMY       No outpatient medications have been marked as taking for the 11/13/19 encounter (Video Visit) with Dorothyann Peng, MD.     Allergies:   Patient has no known allergies.   Social History   Tobacco Use  . Smoking status: Never Smoker  . Smokeless tobacco: Never Used  Substance Use Topics  . Alcohol use: Yes  . Drug use: No     Family Hx: The patient's family history includes Asthma in her mother; Cancer in her father; Early death in her mother; Hyperlipidemia in her father.  ROS:   Please see the history of present illness.    Review of Systems  Constitutional: Negative.   Respiratory: Negative.   Cardiovascular: Negative.   Gastrointestinal: Negative.   Neurological: Negative.   Psychiatric/Behavioral: The patient has insomnia.     All other systems reviewed and are negative.   Labs/Other Tests and Data Reviewed:    Recent Labs: 06/01/2019: ALT 19; BUN 15; Creatinine, Ser 1.03; Potassium 4.8; Sodium 139   Recent Lipid Panel Lab Results  Component Value Date/Time   CHOL 216 (H) 12/07/2017 10:34 AM   TRIG 68 12/07/2017 10:34 AM   HDL 80 12/07/2017 10:34 AM   CHOLHDL 2.7 12/07/2017 10:34 AM   LDLCALC 120 (H) 12/07/2017 10:34 AM    Wt Readings from Last 3 Encounters:  06/14/19 166 lb (75.3 kg)  06/01/19 166 lb 3.2 oz (75.4 kg)  11/29/18 161 lb 6.4 oz (73.2 kg)     Exam:  Vital Signs:  BP 116/77   Pulse (!) 55     Physical Exam  Constitutional: She is oriented to person, place, and time and well-developed, well-nourished, and in no distress.  HENT:  Head: Normocephalic and atraumatic.  Pulmonary/Chest: Effort normal.  Musculoskeletal:     Cervical back: Normal range of motion.  Neurological: She is alert and oriented to person, place, and time.  Psychiatric: Affect normal.  Nursing note and vitals reviewed.   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 She will continue with progesterone cream nightly, except  Sundays. I will call refill into [facility]. She will rto in four months for re-evaluation. She agrees to send labwork from her PCP for my review. I will abstract into her chart at that time.   2. Primary insomnia  Likely stress-related. She is encouraged to continue with magnesium supplementation.  She does not wish to take any rx medication at this time.   COVID-19 Education: The signs and symptoms of COVID-19 were discussed with the patient and how to seek care for testing (follow up with PCP or arrange E-visit).  The importance of social distancing was discussed today.  Patient Risk:   After full review of this patients clinical status, I feel that they are at least moderate risk at this time.  Time:   Today, I have spent 24 minutes with the patient with telehealth technology discussing above diagnoses.     Medication Adjustments/Labs and Tests Ordered: Current medicines are reviewed at length with the patient today.  Concerns regarding medicines are outlined above.   Tests Ordered: No orders of the defined types were placed in this encounter.   Medication Changes: No orders of the defined types were placed in this encounter.   Disposition:  Follow up in 4 month(s)  Signed, [name], MD

## 2019-11-13 NOTE — Telephone Encounter (Signed)
.   Pt understands that although there may be some limitations with this type of visit, we will take all precautions to reduce any security or privacy concerns.  Pt understands that this will be treated like an in office visit and we will file with pt's insurance, and there may be a patient responsible charge related to this service.   Verbal consent given for virtual visit 

## 2019-11-24 ENCOUNTER — Other Ambulatory Visit: Payer: Self-pay

## 2019-11-24 ENCOUNTER — Encounter: Payer: Self-pay | Admitting: Sports Medicine

## 2019-11-24 ENCOUNTER — Ambulatory Visit (INDEPENDENT_AMBULATORY_CARE_PROVIDER_SITE_OTHER): Payer: BC Managed Care – PPO | Admitting: Sports Medicine

## 2019-11-24 DIAGNOSIS — G8929 Other chronic pain: Secondary | ICD-10-CM | POA: Diagnosis not present

## 2019-11-24 DIAGNOSIS — M25512 Pain in left shoulder: Secondary | ICD-10-CM

## 2019-11-24 NOTE — Progress Notes (Signed)
    Procedures performed today:    None.  Independent interpretation of notes and tests performed by another provider:   None.  Brief History, Exam, Impression, and Recommendations:    Chronic left shoulder pain Tracy Franklin returns, she is a pleasant 49 year old female, I saw her back in May, she was continuing to have shoulder pain, impingement in nature, she did have both impingement and glenohumeral signs on exam at the last visit so we performed a glenohumeral and subacromial injections, she returns today approximately 80% better. She feels as though she has plateaued, but at this point would like to see if she can live with it, her goal is to get back into the gym. If her pain returns or is intolerable I'd be happy to proceed with MRI.    ___________________________________________ Ihor Austin. Benjamin Stain, M.D., ABFM., CAQSM. Primary Care and Sports Medicine [facility]  Adjunct Instructor of Family Medicine  [employer]

## 2019-11-24 NOTE — Assessment & Plan Note (Addendum)
Tracy Franklin returns, she is a pleasant 49 year old female, I saw her back in May, she was continuing to have shoulder pain, impingement in nature, she did have both impingement and glenohumeral signs on exam at the last visit so we performed a glenohumeral and subacromial injections, she returns today approximately 80% better. She feels as though she has plateaued, but at this point would like to see if she can live with it, her goal is to get back into the gym. If her pain returns or is intolerable I'd be happy to proceed with MRI.

## 2020-01-17 LAB — HM MAMMOGRAPHY: HM Mammogram: ABNORMAL — AB (ref 0–4)

## 2020-01-18 ENCOUNTER — Encounter: Payer: Self-pay | Admitting: Internal Medicine

## 2020-01-18 LAB — HM MAMMOGRAPHY

## 2020-01-25 ENCOUNTER — Encounter: Payer: Self-pay | Admitting: Internal Medicine

## 2020-02-01 ENCOUNTER — Encounter: Payer: Self-pay | Admitting: Physician Assistant

## 2020-02-09 ENCOUNTER — Emergency Department (INDEPENDENT_AMBULATORY_CARE_PROVIDER_SITE_OTHER): Payer: BC Managed Care – PPO

## 2020-02-09 ENCOUNTER — Emergency Department
Admission: EM | Admit: 2020-02-09 | Discharge: 2020-02-09 | Disposition: A | Discharge status: Home / Self Care | Payer: BC Managed Care – PPO | Source: Home / Self Care | Attending: Family Medicine | Admitting: Family Medicine

## 2020-02-09 ENCOUNTER — Emergency Department: Admit: 2020-02-09 | Discharge status: Absent without leave | Payer: Self-pay

## 2020-02-09 ENCOUNTER — Other Ambulatory Visit: Payer: Self-pay

## 2020-02-09 DIAGNOSIS — S7001XA Contusion of right hip, initial encounter: Secondary | ICD-10-CM

## 2020-02-09 MED ORDER — HYDROCODONE-ACETAMINOPHEN 5-325 MG PO TABS: 1.0000 | ORAL_TABLET | Freq: Four times a day (QID) | ORAL | 0 refills | Status: DC | PRN

## 2020-02-09 NOTE — ED Provider Notes (Signed)
Tracy [facility]    CSN: [id number] Arrival date & time: 02/09/20  1505      History   Chief Complaint Chief Complaint  Patient presents with  . Hip Pain    RT    HPI Glyn Zendejas is a 49 y.o. female.   Patient and her husband were playing disc golf last night when she was hit by a disc on her right hip area.  She has had persistent pain swelling, with occasional pain radiating down her leg.  The history is provided by the patient.  Hip Pain This is a new problem. The current episode started yesterday. The problem occurs constantly. The problem has not changed since onset.The symptoms are aggravated by walking. Nothing relieves the symptoms. Treatments tried: ice packs and motrin. The treatment provided mild relief.    Past Medical History:  Diagnosis Date  . Atrophic vaginitis     Patient Active Problem List   Diagnosis Date Noted  . Chronic left shoulder pain 07/04/2019  . Chondromalacia of patellofemoral joint, 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  . Epigastric pain 06/27/2018  . Nausea 06/27/2018  . Right upper quadrant pain 06/27/2018  . Breast hypertrophy in female 09/30/2017  . Muscle spasm 09/30/2017  . Upper back pain 09/30/2017  . Encounter for screening mammogram for malignant neoplasm of breast 04/09/2015  . Retrocalcaneal bursitis 11/02/2014  . Facet arthritis of lumbar region 11/02/2014  . Menopausal symptoms 05/07/2014  . SI (sacroiliac) joint dysfunction 03/26/2014    Past Surgical History:  Procedure Laterality Date  . ABDOMINAL HYSTERECTOMY      OB History   No obstetric history on file.      Home Medications    Prior to Admission medications   Medication Sig Start Date End Date Taking? Authorizing Provider  Calcium-Magnesium 250-125 MG TABS Take by mouth.    [provider]  Cholecalciferol (VITAMIN D) 125 MCG (5000 UT) CAPS Take 5,000 Units by mouth.     [provider]  HYDROcodone-acetaminophen (NORCO/VICODIN) 5-325 MG  tablet Take 1 tablet by mouth every 6 (six) hours as needed for moderate pain or severe pain. 02/09/20   Kandra Nicolas, MD  meloxicam (MOBIC) 15 MG tablet One tab PO qAM with a meal for 2 weeks, then daily prn pain. 11/13/19   Silverio Decamp, MD  Multiple Vitamins-Minerals (MULTIVITAMIN WITH MINERALS) tablet Take 1 tablet by mouth daily.    [provider]  Progesterone Micronized (PROGESTERONE COMPOUNDING KIT TD) Place onto the skin.    [provider]  VITAMIN E EX Apply topically.    [provider]    Family History Family History  Problem Relation Age of Onset  . Cancer Father        prostate  . Hyperlipidemia Father   . Early death Mother   . Asthma Mother     Social History Social History   Tobacco Use  . Smoking status: Never Smoker  . Smokeless tobacco: Never Used  Vaping Use  . Vaping Use: Never used  Substance Use Topics  . Alcohol use: Yes  . Drug use: No     Allergies   Patient has no known allergies.   Review of Systems Review of Systems  Constitutional: Negative for activity change and fever.  Musculoskeletal: Negative for joint swelling.       Right hip pain   Skin: Negative for color change.  All other systems reviewed and are negative.    Physical Exam Triage Vital Signs ED  Triage Vitals  Enc Vitals Group     BP 02/09/20 1515 (!) 145/89     Pulse Rate 02/09/20 1515 68     Resp 02/09/20 1515 17     Temp 02/09/20 1515 98 F (36.7 C)     Temp Source 02/09/20 1515 Oral     SpO2 02/09/20 1515 98 %     Weight --      Height --      Head Circumference --      Peak Flow --      Pain Score 02/09/20 1518 7     Pain Loc --      Pain Edu? --      Excl. in [place]? --    No data found.  Updated Vital Signs BP (!) 145/89 (BP Location: Right Arm)   Pulse 68   Temp 98 F (36.7 C) (Oral)   Resp 17   SpO2 98%   Visual Acuity Right Eye Distance:   Left Eye Distance:   Bilateral Distance:    Right Eye Near:    Left Eye Near:    Bilateral Near:     Physical Exam Vitals and nursing note reviewed.  Constitutional:      General: She is not in acute distress. HENT:     Head: Atraumatic.     Nose: Nose normal.  Eyes:     Pupils: Pupils are equal, round, and reactive to light.  Cardiovascular:     Rate and Rhythm: Normal rate.     Heart sounds: Normal heart sounds.  Pulmonary:     Effort: Pulmonary effort is normal.  Musculoskeletal:     Cervical back: Normal range of motion.       Legs:     Comments: Hematoma over right greater trochanter with tenderness to palpation.  Hip flexion and lateral abduction intact although movements cause pain.  Distal neurovascular function is intact.   Skin:    General: Skin is warm and dry.  Neurological:     Mental Status: She is alert.      UC Treatments / Results  Labs (all labs ordered are listed, but only abnormal results are displayed) Labs Reviewed - No data to display  EKG   Radiology DG Hip Unilat W or Wo Pelvis 2-3 Views Right  Result Date: 02/09/2020 CLINICAL DATA:  Contusion to the hip EXAM: DG HIP (WITH OR WITHOUT PELVIS) 2-3V RIGHT COMPARISON:  None. FINDINGS: There is no evidence of hip fracture or dislocation. There is no evidence of arthropathy or other focal bone abnormality. IMPRESSION: Negative. Electronically Signed   By: Donavan Foil M.D.   On: 02/09/2020 16:58    Procedures Procedures (including critical care time)  Medications Ordered in UC Medications - No data to display  Initial Impression / Assessment and Plan / UC Course  I have reviewed the triage vital signs and the nursing notes.  Pertinent labs & imaging results that were available during my care of the patient were reviewed by me and considered in my medical decision making (see chart for details).    No evidence fracture  Rx for Lortab (#12, no refill). Given treatment instructions with range of motion and stretching exercises.  Followup with Dr.  Aundria Mems ([facility]) if not improving about two weeks.    Final Clinical Impressions(s) / UC Diagnoses   Final diagnoses:  Contusion of right hip, initial encounter     Discharge Instructions     Apply ice pack  for 20 to 30 minutes, 3 to 4 times daily  Continue until pain and swelling decrease.  Minimize activity until improved.  May take Ibuprofen 256m, 4 tabs every 8 hours with food.  Begin range of motion and stretching exercises as tolerated.    ED Prescriptions    Medication Sig Dispense Auth. Provider   HYDROcodone-acetaminophen (NORCO/VICODIN) 5-325 MG tablet Take 1 tablet by mouth every 6 (six) hours as needed for moderate pain or severe pain. 12 tablet BKandra Nicolas MD        BKandra Nicolas MD 02/12/20 [phone]

## 2020-02-09 NOTE — ED Triage Notes (Signed)
Pt c/o RT hip pain since last night when she injured herself being hit by disc. Iced and motrin prn. Last dose 1230pm. Bruised and swollen, pain radiates down leg.

## 2020-02-09 NOTE — Discharge Instructions (Addendum)
Apply ice pack for 20 to 30 minutes, 3 to 4 times daily  Continue until pain and swelling decrease.  Minimize activity until improved.  May take Ibuprofen 200mg , 4 tabs every 8 hours with food.  Begin range of motion and stretching exercises as tolerated.

## 2020-03-06 ENCOUNTER — Ambulatory Visit: Payer: BC Managed Care – PPO | Admitting: Internal Medicine

## 2020-04-08 ENCOUNTER — Telehealth: Payer: Self-pay

## 2020-04-08 NOTE — Telephone Encounter (Signed)
I left the pt a message that I was calling to see if she could come for a an appt tomorrow at 4:15 with Dr. Allyne Gee instead of the 4 pm on this Thursday.

## 2020-04-09 ENCOUNTER — Other Ambulatory Visit: Payer: Self-pay

## 2020-04-09 ENCOUNTER — Ambulatory Visit: Payer: BC Managed Care – PPO | Admitting: Internal Medicine

## 2020-04-09 ENCOUNTER — Encounter: Payer: Self-pay | Admitting: Internal Medicine

## 2020-04-09 VITALS — BP 112/70 | HR 61 | Temp 97.4°F | Ht 65.0 in | Wt 169.8 lb

## 2020-04-09 DIAGNOSIS — N951 Menopausal and female climacteric states: Secondary | ICD-10-CM

## 2020-04-09 DIAGNOSIS — N952 Postmenopausal atrophic vaginitis: Secondary | ICD-10-CM

## 2020-04-09 DIAGNOSIS — Z79899 Other long term (current) drug therapy: Secondary | ICD-10-CM | POA: Diagnosis not present

## 2020-04-09 DIAGNOSIS — F5101 Primary insomnia: Secondary | ICD-10-CM

## 2020-04-09 NOTE — Progress Notes (Signed)
I,Katawbba Wiggins,acting as a Education administrator for [name], MD.,have documented all relevant documentation on the behalf of [name], MD,as directed by  [name], MD while in the presence of [name], MD.  This visit occurred during the SARS-CoV-2 public health emergency.  Safety protocols were in place, including screening questions prior to the visit, additional usage of staff PPE, and extensive cleaning of exam room while observing appropriate contact time as indicated for disinfecting solutions.  Subjective:     Patient ID: Tracy Franklin , female    DOB: 1970/10/02 , 49 y.o.   MRN: [id number]   Chief Complaint  Patient presents with  . hormones f/u    HPI  The patient is here today for follow-up on her hormones.  She is currently using compounded progesterone supplementation. She is not having any issues with her treatment.     Past Medical History:  Diagnosis Date  . Atrophic vaginitis      Family History  Problem Relation Age of Onset  . Cancer Father        prostate  . Hyperlipidemia Father   . Early death Mother   . Asthma Mother      Current Outpatient Medications:  .  Calcium-Magnesium 250-125 MG TABS, Take by mouth., Disp: , Rfl:  .  Cholecalciferol (VITAMIN D) 125 MCG (5000 UT) CAPS, Take 5,000 Units by mouth. , Disp: , Rfl:  .  Multiple Vitamins-Minerals (MULTIVITAMIN WITH MINERALS) tablet, Take 1 tablet by mouth daily., Disp: , Rfl:  .  Progesterone Micronized (PROGESTERONE COMPOUNDING KIT TD), Place onto the skin., Disp: , Rfl:  .  VITAMIN E EX, Apply topically., Disp: , Rfl:    No Known Allergies   Review of Systems  Constitutional: Negative.   Respiratory: Negative.   Cardiovascular: Negative.   Gastrointestinal: Negative.   Genitourinary:       She has vaginal dryness.   Psychiatric/Behavioral: Negative.   All other systems reviewed and are negative.    Today's Vitals   04/09/20 1213  BP: 112/70  Pulse: 61  Temp: (!) 97.4  F (36.3 C)  TempSrc: Oral  Weight: 169 lb 12.8 oz (77 kg)  Height: '5\' 5"'  (1.651 m)   Body mass index is 28.26 kg/m.  Wt Readings from Last 3 Encounters:  04/09/20 169 lb 12.8 oz (77 kg)  06/14/19 166 lb (75.3 kg)  06/01/19 166 lb 3.2 oz (75.4 kg)   Objective:  Physical Exam Vitals and nursing note reviewed.  Constitutional:      Appearance: Normal appearance.  HENT:     Head: Normocephalic and atraumatic.  Cardiovascular:     Rate and Rhythm: Normal rate and regular rhythm.     Heart sounds: Normal heart sounds.  Pulmonary:     Breath sounds: Normal breath sounds.  Skin:    General: Skin is warm.  Neurological:     General: No focal deficit present.     Mental Status: She is alert and oriented to person, place, and time.         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 She agrees to f/u in four months for re-evaluation.   2. Primary insomnia Comments: She was given samples of Dayvigo 72m po at bedtime as needed. Also encouraged to develo a bedtime routine. Magnesium supplementation should also be beneficial.   3. Atrophic vaginitis Comments: She has tried vitamin E suppositories in  the past, but finds they are messy. I will contact [facility] to see if there are other options.   4. Drug therapy - CMP14+EGFR     Patient was given opportunity to ask questions. Patient verbalized understanding of the plan and was able to repeat key elements of the plan. All questions were answered to their satisfaction.  [name], MD   I, [name], MD, have reviewed all documentation for this visit. The documentation on 05/05/20 for the exam, diagnosis, procedures, and orders are all accurate and complete.  THE PATIENT IS ENCOURAGED TO PRACTICE SOCIAL DISTANCING DUE TO THE COVID-19 PANDEMIC.

## 2020-04-10 LAB — CMP14+EGFR
ALT: 16 IU/L (ref 0–32)
AST: 19 IU/L (ref 0–40)
Albumin/Globulin Ratio: 2.1 (ref 1.2–2.2)
Albumin: 5.2 g/dL — ABNORMAL HIGH (ref 3.8–4.8)
Alkaline Phosphatase: 77 IU/L (ref 44–121)
BUN/Creatinine Ratio: 18 (ref 9–23)
BUN: 15 mg/dL (ref 6–24)
Bilirubin Total: 0.3 mg/dL (ref 0.0–1.2)
CO2: 23 mmol/L (ref 20–29)
Calcium: 10 mg/dL (ref 8.7–10.2)
Chloride: 103 mmol/L (ref 96–106)
Creatinine, Ser: 0.84 mg/dL (ref 0.57–1.00)
GFR calc Af Amer: 94 mL/min/{1.73_m2} (ref >59)
GFR calc non Af Amer: 81 mL/min/{1.73_m2} (ref >59)
Globulin, Total: 2.5 g/dL (ref 1.5–4.5)
Glucose: 85 mg/dL (ref 65–99)
Potassium: 4.3 mmol/L (ref 3.5–5.2)
Sodium: 142 mmol/L (ref 134–144)
Total Protein: 7.7 g/dL (ref 6.0–8.5)

## 2020-04-11 ENCOUNTER — Ambulatory Visit: Payer: BC Managed Care – PPO | Admitting: Internal Medicine

## 2020-04-12 ENCOUNTER — Encounter: Payer: Self-pay | Admitting: Internal Medicine

## 2020-05-06 ENCOUNTER — Other Ambulatory Visit: Payer: Self-pay | Admitting: Internal Medicine

## 2020-05-06 DIAGNOSIS — N951 Menopausal and female climacteric states: Secondary | ICD-10-CM

## 2020-05-13 ENCOUNTER — Other Ambulatory Visit: Payer: BC Managed Care – PPO

## 2020-05-13 ENCOUNTER — Telehealth: Payer: Self-pay

## 2020-05-13 NOTE — Telephone Encounter (Signed)
error 

## 2020-05-20 ENCOUNTER — Other Ambulatory Visit: Payer: BC Managed Care – PPO

## 2020-05-20 ENCOUNTER — Other Ambulatory Visit: Payer: Self-pay

## 2020-05-20 DIAGNOSIS — N951 Menopausal and female climacteric states: Secondary | ICD-10-CM

## 2020-05-21 LAB — TSH: TSH: 2.18 u[IU]/mL (ref 0.450–4.500)

## 2020-08-13 ENCOUNTER — Other Ambulatory Visit: Payer: Self-pay

## 2020-08-13 ENCOUNTER — Ambulatory Visit (INDEPENDENT_AMBULATORY_CARE_PROVIDER_SITE_OTHER): Payer: BC Managed Care – PPO | Admitting: Internal Medicine

## 2020-08-13 ENCOUNTER — Encounter: Payer: Self-pay | Admitting: Internal Medicine

## 2020-08-13 VITALS — BP 112/62 | HR 69 | Temp 97.6°F | Ht 65.0 in | Wt 164.2 lb

## 2020-08-13 DIAGNOSIS — K581 Irritable bowel syndrome with constipation: Secondary | ICD-10-CM

## 2020-08-13 DIAGNOSIS — D696 Thrombocytopenia, unspecified: Secondary | ICD-10-CM | POA: Insufficient documentation

## 2020-08-13 DIAGNOSIS — M791 Myalgia, unspecified site: Secondary | ICD-10-CM

## 2020-08-13 DIAGNOSIS — N951 Menopausal and female climacteric states: Secondary | ICD-10-CM | POA: Diagnosis not present

## 2020-08-13 DIAGNOSIS — M2550 Pain in unspecified joint: Secondary | ICD-10-CM

## 2020-08-13 DIAGNOSIS — E559 Vitamin D deficiency, unspecified: Secondary | ICD-10-CM

## 2020-08-13 NOTE — Progress Notes (Signed)
I,Katawbba Wiggins,acting as a Education administrator for [name], MD.,have documented all relevant documentation on the behalf of [name], MD,as directed by  [name], MD while in the presence of [name], MD.  This visit occurred during the SARS-CoV-2 public health emergency.  Safety protocols were in place, including screening questions prior to the visit, additional usage of staff PPE, and extensive cleaning of exam room while observing appropriate contact time as indicated for disinfecting solutions.  Subjective:     Patient ID: Tracy Franklin , female    DOB: [birth year] , 50 y.o.   MRN: [id number]   Chief Complaint  Patient presents with  . Hormones f/u    HPI  The patient is here today for follow-up on her hormones.  She is currently using compounded progesterone supplementation. She is not having any issues with her treatment. She would like to add that she was diagnosed in the past with fibromyalgia. She is interested in pursuing further evaluation at [facility] in [place].     Past Medical History:  Diagnosis Date  . Atrophic vaginitis      Family History  Problem Relation Age of Onset  . Cancer Father        prostate  . Hyperlipidemia Father   . Early death Mother   . Asthma Mother      Current Outpatient Medications:  .  Calcium-Magnesium 250-125 MG TABS, Take by mouth., Disp: , Rfl:  .  Cholecalciferol (VITAMIN D) 125 MCG (5000 UT) CAPS, Take 5,000 Units by mouth. , Disp: , Rfl:  .  Multiple Vitamins-Minerals (MULTIVITAMIN WITH MINERALS) tablet, Take 1 tablet by mouth daily., Disp: , Rfl:  .  Progesterone Micronized (PROGESTERONE COMPOUNDING KIT TD), Place onto the skin., Disp: , Rfl:  .  VITAMIN E EX, Apply topically., Disp: , Rfl:    No Known Allergies   Review of Systems  Constitutional: Negative.   Respiratory: Negative.   Cardiovascular: Negative.   Gastrointestinal: Positive for constipation.  Musculoskeletal:  Positive for arthralgias and myalgias.  Psychiatric/Behavioral: Negative.   All other systems reviewed and are negative.    Today's Vitals   08/13/20 1148  BP: 112/62  Pulse: 69  Temp: 97.6 F (36.4 C)  TempSrc: Oral  Weight: 164 lb 3.2 oz (74.5 kg)  Height: '5\' 5"'  (1.651 m)   Body mass index is 27.32 kg/m.   Objective:  Physical Exam Vitals and nursing note reviewed.  Constitutional:      Appearance: Normal appearance.  HENT:     Head: Normocephalic and atraumatic.     Nose:     Comments: Masked     Mouth/Throat:     Comments: Masked  Cardiovascular:     Rate and Rhythm: Normal rate and regular rhythm.     Heart sounds: Normal heart sounds.  Pulmonary:     Breath sounds: Normal breath sounds.  Skin:    General: Skin is warm.  Neurological:     General: No focal deficit present.     Mental Status: She is alert and oriented to person, place, and time.         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 She will f/u in 4 months.   2. Myalgia Comments: Reports history of fibromyalgia. Encouraged to follow anti-inflammatory diet. She is encouraged to f/u with integrative med practice. We also discussed the possibility of food sensitivities which could be  contributing to her sx. Pt advised this type of testing is usually not covered by insurance.  - CK, total - IgG Allergens(96) Foods - Allergens (95) Foods  3. Irritable bowel syndrome with constipation Comments: Chronic, may benefit from low dose Linzess. Advised to take upon awakening, wait to eat at least 30 minutes.   4. Arthralgia, unspecified joint Comments: I will check an arthritis panel. Encouraged to incorporate more ginger/turmeric into her diet.  - ANA, IFA (with reflex) - CYCLIC CITRUL PEPTIDE ANTIBODY, IGG/IGA - Rheumatoid factor - Sedimentation rate - Uric acid  5. Vitamin D deficiency disease Comments: I will check vitamin  D level and supplement as needed.  - Vitamin D (25 hydroxy)  6. Decreased platelet count ([place]) Comments: I will recheck CBC today. I will make further recommendations once her labs are available for review.  - CBC no Diff   Patient was given opportunity to ask questions. Patient verbalized understanding of the plan and was able to repeat key elements of the plan. All questions were answered to their satisfaction.   I, [name], MD, have reviewed all documentation for this visit. The documentation on 08/13/20 for the exam, diagnosis, procedures, and orders are all accurate and complete.  THE PATIENT IS ENCOURAGED TO PRACTICE SOCIAL DISTANCING DUE TO THE COVID-19 PANDEMIC.

## 2020-08-14 LAB — CK: Total CK: 102 U/L (ref 32–182)

## 2020-08-14 LAB — CBC
Hematocrit: 41.3 % (ref 34.0–46.6)
Hemoglobin: 13.8 g/dL (ref 11.1–15.9)
MCH: 31.4 pg (ref 26.6–33.0)
MCHC: 33.4 g/dL (ref 31.5–35.7)
MCV: 94 fL (ref 79–97)
Platelets: 225 10*3/uL (ref 150–450)
RBC: 4.4 x10E6/uL (ref 3.77–5.28)
RDW: 12.2 % (ref 11.7–15.4)
WBC: 5.6 10*3/uL (ref 3.4–10.8)

## 2020-08-14 LAB — CYCLIC CITRUL PEPTIDE ANTIBODY, IGG/IGA: Cyclic Citrullin Peptide Ab: 5 units (ref 0–19)

## 2020-08-14 LAB — ANTINUCLEAR ANTIBODIES, IFA: ANA Titer 1: NEGATIVE

## 2020-08-14 LAB — SEDIMENTATION RATE: Sed Rate: 8 mm/hr (ref 0–40)

## 2020-08-14 LAB — URIC ACID: Uric Acid: 5.7 mg/dL (ref 2.6–6.2)

## 2020-08-14 LAB — VITAMIN D 25 HYDROXY (VIT D DEFICIENCY, FRACTURES): Vit D, 25-Hydroxy: 56.4 ng/mL (ref 30.0–100.0)

## 2020-08-14 LAB — RHEUMATOID FACTOR: Rheumatoid fact SerPl-aCnc: <10 IU/mL (ref <14.0)

## 2020-08-16 LAB — ALLERGENS (95) FOODS IGE
Allergen Apple, IgE: <0.1 kU/L
Allergen Banana IgE: <0.1 kU/L
Allergen Barley IgE: <0.1 kU/L
Allergen Black Pepper IgE: <0.1 kU/L
Allergen Blueberry IgE: <0.1 kU/L
Allergen Broccoli: <0.1 kU/L
Allergen Cabbage IgE: <0.1 kU/L
Allergen Carrot IgE: <0.1 kU/L
Allergen Cauliflower IgE: <0.1 kU/L
Allergen Celery IgE: <0.1 kU/L
Allergen Cinnamon IgE: <0.1 kU/L
Allergen Coconut IgE: <0.1 kU/L
Allergen Corn, IgE: <0.1 kU/L
Allergen Cucumber IgE: <0.1 kU/L
Allergen Garlic IgE: <0.1 kU/L
Allergen Ginger IgE: <0.1 kU/L
Allergen Gluten IgE: <0.1 kU/L
Allergen Grape IgE: <0.1 kU/L
Allergen Grapefruit IgE: <0.1 kU/L
Allergen Green Bean IgE: <0.1 kU/L
Allergen Green Pea IgE: <0.1 kU/L
Allergen Lamb IgE: <0.1 kU/L
Allergen Lettuce IgE: <0.1 kU/L
Allergen Lime IgE: <0.1 kU/L
Allergen Melon IgE: <0.1 kU/L
Allergen Oat IgE: <0.1 kU/L
Allergen Onion IgE: <0.1 kU/L
Allergen Pear IgE: <0.1 kU/L
Allergen Potato, White IgE: <0.1 kU/L
Allergen Rice IgE: <0.1 kU/L
Allergen Salmon IgE: <0.1 kU/L
Allergen Strawberry IgE: <0.1 kU/L
Allergen Sweet Potato IgE: <0.1 kU/L
Allergen Tomato, IgE: <0.1 kU/L
Allergen Turkey IgE: <0.1 kU/L
Allergen Watermelon IgE: <0.1 kU/L
Allergen, Peach f95: <0.1 kU/L
Basil: <0.1 kU/L
Beef IgE: <0.1 kU/L
C074-IgE Gelatin: <0.1 kU/L
Chicken IgE: <0.1 kU/L
Chocolate/Cacao IgE: <0.1 kU/L
Clam IgE: <0.1 kU/L
Codfish IgE: <0.1 kU/L
Coffee: <0.1 kU/L
Cranberry IgE: <0.1 kU/L
Egg White IgE: <0.1 kU/L
F020-IgE Almond: <0.1 kU/L
F023-IgE Crab: <0.1 kU/L
F045-IgE Yeast: <0.1 kU/L
F076-IgE Alpha Lactalbumin: <0.1 kU/L
F077-IgE Beta Lactoglobulin: <0.1 kU/L
F078-IgE Casein: <0.1 kU/L
F080-IgE Lobster: <0.1 kU/L
F081-IgE Cheese, Cheddar Type: <0.1 kU/L
F089-IgE Mustard: <0.1 kU/L
F096-IgE Avocado: <0.1 kU/L
F202-IgE Cashew Nut: <0.1 kU/L
F214-IgE Spinach: <0.1 kU/L
F222-IgE Tea: <0.1 kU/L
F242-IgE Bing Cherry: <0.1 kU/L
F261-IgE Asparagus: <0.1 kU/L
F262-IgE Eggplant: <0.1 kU/L
F265-IgE Cumin: <0.1 kU/L
F278-IgE Bayleaf (Laurel): <0.1 kU/L
F279-IgE Chili Pepper: <0.1 kU/L
F283-IgE Oregano: <0.1 kU/L
F300-IgE Goat's Milk: <0.1 kU/L
F342-IgE Olive, Black: <0.1 kU/L
F343-IgE Raspberry: <0.1 kU/L
Hops: <0.1 kU/L
IgE Egg (Yolk): <0.1 kU/L
Kidney Bean IgE: <0.1 kU/L
Lemon: <0.1 kU/L
Lima Bean IgE: <0.1 kU/L
Malt: <0.1 kU/L
Mushroom IgE: <0.1 kU/L
Orange: <0.1 kU/L
Paprika IgE: <0.1 kU/L
Peanut IgE: <0.1 kU/L
Pineapple IgE: <0.1 kU/L
Pork IgE: <0.1 kU/L
Pumpkin IgE: <0.1 kU/L
Red Beet: <0.1 kU/L
Rye IgE: <0.1 kU/L
Scallop IgE: <0.1 kU/L
Sesame Seed IgE: <0.1 kU/L
Shrimp IgE: <0.1 kU/L
Soybean IgE: <0.1 kU/L
Tuna: <0.1 kU/L
Vanilla: <0.1 kU/L
Walnut IgE: <0.1 kU/L
Wheat IgE: <0.1 kU/L
Whey: <0.1 kU/L
White Bean IgE: <0.1 kU/L

## 2020-10-07 ENCOUNTER — Telehealth (INDEPENDENT_AMBULATORY_CARE_PROVIDER_SITE_OTHER): Payer: BC Managed Care – PPO | Admitting: Family Medicine

## 2020-10-07 ENCOUNTER — Encounter: Payer: Self-pay | Admitting: Family Medicine

## 2020-10-07 DIAGNOSIS — H1031 Unspecified acute conjunctivitis, right eye: Secondary | ICD-10-CM | POA: Diagnosis not present

## 2020-10-07 MED ORDER — POLYMYXIN B-TRIMETHOPRIM 10000-0.1 UNIT/ML-% OP SOLN: 1.0000 [drp] | OPHTHALMIC | 0 refills | Status: DC

## 2020-10-07 NOTE — Patient Instructions (Signed)
Bacterial Conjunctivitis, Adult Bacterial conjunctivitis is an infection of the clear membrane that covers the white part of your eye and the inner surface of your eyelid (conjunctiva). When the blood vessels in your conjunctiva become inflamed, your eye becomes red or pink, and it will probably feel itchy. Bacterial conjunctivitis spreads very easily from person to person (is contagious). It also spreads easily from one eye to the other eye. What are the causes? This condition is caused by bacteria. You may get the infection if you come into close contact with:  A person who is infected with the bacteria.  Items that are contaminated with the bacteria, such as a face towel, contact lens solution, or eye makeup. What increases the risk? You are more likely to develop this condition if you:  Are exposed to other people who have the infection.  Wear contact lenses.  Have a sinus infection.  Have had a recent eye injury or surgery.  Have a weak body defense system (immune system).  Have a medical condition that causes dry eyes. What are the signs or symptoms? Symptoms of this condition include:  Thick, yellowish discharge from the eye. This may turn into a crust on the eyelid overnight and cause your eyelids to stick together.  Tearing or watery eyes.  Itchy eyes.  Burning feeling in your eyes.  Eye redness.  Swollen eyelids.  Blurred vision.   How is this diagnosed? This condition is diagnosed based on your symptoms and medical history. Your health care provider may also take a sample of discharge from your eye to find the cause of your infection. This is rarely done. How is this treated? This condition may be treated with:  Antibiotic eye drops or ointment to clear the infection more quickly and prevent the spread of infection to others.  Oral antibiotic medicines to treat infections that do not respond to drops or ointments or that last longer than 10 days.  Cool, wet  cloths (cool compresses) placed on the eyes.  Artificial tears applied 2-6 times a day.   Follow these instructions at home: Medicines  Take or apply your antibiotic medicine as told by your health care provider. Do not stop taking or applying the antibiotic even if you start to feel better.  Take or apply over-the-counter and prescription medicines only as told by your health care provider.  Be very careful to avoid touching the edge of your eyelid with the eye-drop bottle or the ointment tube when you apply medicines to the affected eye. This will keep you from spreading the infection to your other eye or to other people. Managing discomfort  Gently wipe away any drainage from your eye with a warm, wet washcloth or a cotton ball.  Apply a clean, cool compress to your eye for 10-20 minutes, 3-4 times a day. General instructions  Do not wear contact lenses until the inflammation is gone and your health care provider says it is safe to wear them again. Ask your health care provider how to sterilize or replace your contact lenses before you use them again. Wear glasses until you can resume wearing contact lenses.  Avoid wearing eye makeup until the inflammation is gone. Throw away any old eye cosmetics that may be contaminated.  Change or wash your pillowcase every day.  Do not share towels or washcloths. This may spread the infection.  Wash your hands often with soap and water. Use paper towels to dry your hands.  Avoid touching or rubbing your   eyes.  Do not drive or use heavy machinery if your vision is blurred. Contact a health care provider if:  You have a fever.  Your symptoms do not get better after 10 days. Get help right away if you have:  A fever and your symptoms suddenly get worse.  Severe pain when you move your eye.  Facial pain, redness, or swelling.  Sudden loss of vision. Summary  Bacterial conjunctivitis is an infection of the clear membrane that covers  the white part of your eye and the inner surface of your eyelid (conjunctiva).  Bacterial conjunctivitis spreads very easily from person to person (is contagious).  Wash your hands often with soap and water. Use paper towels to dry your hands.  Take or apply your antibiotic medicine as told by your health care provider. Do not stop taking or applying the antibiotic even if you start to feel better.  Contact a health care provider if you have a fever or your symptoms do not get better after 10 days. This information is not intended to replace advice given to you by your health care provider. Make sure you discuss any questions you have with your health care provider. Document Revised: 09/27/2018 Document Reviewed: 01/12/2018 Elsevier Patient Education  2021 Elsevier Inc.  

## 2020-10-07 NOTE — Progress Notes (Signed)
Virtual Video Visit via MyChart Note  I connected with  Tracy Franklin on 10/07/20 at  9:10 AM EDT by the video enabled telemedicine application for MyChart, and verified that I am speaking with the correct person using two identifiers.   I introduced myself as a Publishing rights manager with the practice. We discussed the limitations of evaluation and management by telemedicine and the availability of in person appointments. The patient expressed understanding and agreed to proceed.  Participating parties in this visit include: The patient and the nurse practitioner listed.  The patient is: At home I am: In the office - [facility]  Subjective:    CC:  Chief Complaint  Patient presents with  . Conjunctivitis    HPI: Tracy Franklin is a 50 y.o. year old female presenting today via MyChart today for eye irritation.  Patient with symptoms starting yesterday. Last week she was outside a lot and intimally thought her symptoms were allergy related, but the eye symptoms progressed and she has not had any other allergy symptoms - no itchy nose/throat, nasal congestion, rhinorrhea, sneezing, etc.   Yesterday she noticed her right eye was itching and burning a lot, she changed out her contacts and did not notice any improvement with that. The discomfort worsened and she now feels like she has sandpaper in her eye. Beginning last night and continuing now, she is having to use a warm rag to clean off her eye frequently because of a lot of yellow "gooping and crusting" in her eye. She reports her eye lid seems a little puffy, but she is not having any pain or vision changes. States her sclera is slightly pink, but not bright red. I attempted to view on video, but was struggling to get a clear image because of connection issues.   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  Past medical history, Surgical history,  Family history not pertinant except as noted below, Social history, Allergies, and medications have been entered into the medical record, reviewed, and corrections made.   Review of Systems:  All review of systems negative except what is listed in the HPI   Objective:    General:  Speaking clearly in complete sentences. Absent shortness of breath noted.   Alert and oriented x3.   Normal judgment.  Absent acute distress.   Impression and Recommendations:    1. Acute bacterial conjunctivitis of right eye I was unable to get a good picture via the video call, but based on her description (and environment being around preschoolers every day), I would like to go ahead and treat as bacterial conjunctivitis. We discussed pink eye, contagious period, need to remove her contacts until infection is cleared, artificial tears for comfort, warm compresses to remove discharge, avoiding touching her eyes, good hand hygiene, etc. Educated on signs and symptoms that would require further evaluation.    - trimethoprim-polymyxin b (POLYTRIM) ophthalmic solution; Place 1 drop into the right eye every 4 (four) hours. For 7 days  Dispense: 10 mL; Refill: 0   Follow-up if symptoms worsen or fail to improve.    I discussed the assessment and treatment plan with the patient. The patient was provided an opportunity to ask questions and all were answered. The patient agreed with the plan and demonstrated an understanding of the instructions.   The patient was advised to call back or seek an in-person evaluation if the symptoms worsen or  if the condition fails to improve as anticipated.  I provided 20 minutes of non-face-to-face interaction with this MYCHART visit including intake, same-day documentation, and chart review.   Clayborne Dana, NP

## 2020-12-16 ENCOUNTER — Encounter: Payer: Self-pay | Admitting: Internal Medicine

## 2020-12-16 ENCOUNTER — Other Ambulatory Visit: Payer: Self-pay

## 2020-12-16 ENCOUNTER — Ambulatory Visit (INDEPENDENT_AMBULATORY_CARE_PROVIDER_SITE_OTHER): Payer: BC Managed Care – PPO | Admitting: Internal Medicine

## 2020-12-16 VITALS — BP 112/70 | HR 89 | Temp 98.7°F | Ht 65.0 in | Wt 172.2 lb

## 2020-12-16 DIAGNOSIS — Z6828 Body mass index (BMI) 28.0-28.9, adult: Secondary | ICD-10-CM | POA: Diagnosis not present

## 2020-12-16 DIAGNOSIS — N951 Menopausal and female climacteric states: Secondary | ICD-10-CM | POA: Diagnosis not present

## 2020-12-16 DIAGNOSIS — Z79890 Hormone replacement therapy (postmenopausal): Secondary | ICD-10-CM

## 2020-12-16 NOTE — Progress Notes (Signed)
I,Katawbba Wiggins,acting as a Education administrator for [name], MD.,have documented all relevant documentation on the behalf of [name], MD,as directed by  [name], MD while in the presence of [name], MD.  This visit occurred during the SARS-CoV-2 public health emergency.  Safety protocols were in place, including screening questions prior to the visit, additional usage of staff PPE, and extensive cleaning of exam room while observing appropriate contact time as indicated for disinfecting solutions.  Subjective:     Patient ID: Tracy Franklin , female    DOB: [birth year] , 50 y.o.   MRN: [id number]   Chief Complaint  Patient presents with   hormones f/u    HPI  The patient is here today for follow-up on her hormones.  She feels well on her current regimen, using progesterone nightly except Sundays.     Past Medical History:  Diagnosis Date   Atrophic vaginitis      Family History  Problem Relation Age of Onset   Cancer Father        prostate   Hyperlipidemia Father    Early death Mother    Asthma Mother      Current Outpatient Medications:    Calcium-Magnesium 250-125 MG TABS, Take by mouth., Disp: , Rfl:    Cholecalciferol (VITAMIN D) 125 MCG (5000 UT) CAPS, Take 5,000 Units by mouth. , Disp: , Rfl:    Progesterone Micronized (PROGESTERONE COMPOUNDING KIT TD), Place onto the skin., Disp: , Rfl:    trimethoprim-polymyxin b (POLYTRIM) ophthalmic solution, Place 1 drop into the right eye every 4 (four) hours. For 7 days, Disp: 10 mL, Rfl: 0   VITAMIN E EX, Apply topically., Disp: , Rfl:    Multiple Vitamins-Minerals (MULTIVITAMIN WITH MINERALS) tablet, Take 1 tablet by mouth daily., Disp: , Rfl:    No Known Allergies   Review of Systems  Constitutional: Negative.   Respiratory: Negative.    Cardiovascular: Negative.   Gastrointestinal: Negative.   Psychiatric/Behavioral: Negative.    All other systems reviewed and are negative.   Today's Vitals    12/16/20 1200  BP: 112/70  Pulse: 89  Temp: 98.7 F (37.1 C)  TempSrc: Oral  Weight: 172 lb 3.2 oz (78.1 kg)  Height: _0  (1.651 m)   Body mass index is 28.66 kg/m.  Wt Readings from Last 3 Encounters:  12/16/20 172 lb 3.2 oz (78.1 kg)  08/13/20 164 lb 3.2 oz (74.5 kg)  04/09/20 169 lb 12.8 oz (77 kg)    BP Readings from Last 3 Encounters:  12/16/20 112/70  08/13/20 112/62  04/09/20 112/70    Objective:  Physical Exam Vitals and nursing note reviewed.  Constitutional:      Appearance: Normal appearance.  HENT:     Head: Normocephalic and atraumatic.     Nose:     Comments: Masked     Mouth/Throat:     Comments: Masked  Eyes:     Extraocular Movements: Extraocular movements intact.  Cardiovascular:     Rate and Rhythm: Normal rate and regular rhythm.     Heart sounds: Normal heart sounds.  Pulmonary:     Effort: Pulmonary effort is normal.     Breath sounds: Normal breath sounds.  Skin:    General: Skin is warm.  Neurological:     General: No focal deficit present.     Mental Status: She is alert.  Psychiatric:        Mood and Affect: Mood normal.  Behavior: Behavior normal.       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 If effective, she will remain on this dose. She agrees to f/u in six months for re-evaluation. She is aware that she may return sooner as needed.   2. Body mass index (BMI) of 28.0-28.9 in adult Comments: We discussed importance of strength training during menopausal years. She is encouraged to incorporate this at least twice weekly.   3. Post-menopause on HRT (hormone replacement therapy) - CMP14+EGFR   Patient was given opportunity to ask questions. Patient verbalized understanding of the plan and was able to repeat key elements of the plan. All questions were answered to their satisfaction.   I, [name], MD, have reviewed all documentation for this visit. The documentation on 12/16/20 for the exam, diagnosis, procedures, and orders are all accurate and complete.   IF YOU HAVE BEEN REFERRED TO A SPECIALIST, IT MAY TAKE 1-2 WEEKS TO SCHEDULE/PROCESS THE REFERRAL. IF YOU HAVE NOT HEARD FROM US/SPECIALIST IN TWO WEEKS, PLEASE GIVE [place] A CALL AT [phone].   THE PATIENT IS ENCOURAGED TO PRACTICE SOCIAL DISTANCING DUE TO THE COVID-19 PANDEMIC.

## 2020-12-16 NOTE — Patient Instructions (Signed)
[employer] - Agricultural consultant

## 2020-12-17 LAB — CMP14+EGFR
ALT: 29 IU/L (ref 0–32)
AST: 26 IU/L (ref 0–40)
Albumin/Globulin Ratio: 1.7 (ref 1.2–2.2)
Albumin: 4.6 g/dL (ref 3.8–4.8)
Alkaline Phosphatase: 74 IU/L (ref 44–121)
BUN/Creatinine Ratio: 10 (ref 9–23)
BUN: 8 mg/dL (ref 6–24)
Bilirubin Total: 0.4 mg/dL (ref 0.0–1.2)
CO2: 25 mmol/L (ref 20–29)
Calcium: 10.3 mg/dL — ABNORMAL HIGH (ref 8.7–10.2)
Chloride: 103 mmol/L (ref 96–106)
Creatinine, Ser: 0.83 mg/dL (ref 0.57–1.00)
Globulin, Total: 2.7 g/dL (ref 1.5–4.5)
Glucose: 84 mg/dL (ref 65–99)
Potassium: 4.8 mmol/L (ref 3.5–5.2)
Sodium: 146 mmol/L — ABNORMAL HIGH (ref 134–144)
Total Protein: 7.3 g/dL (ref 6.0–8.5)
eGFR: 86 mL/min/{1.73_m2} (ref >59)

## 2021-04-08 ENCOUNTER — Encounter: Payer: Self-pay | Admitting: Internal Medicine

## 2021-05-05 IMAGING — DX DG KNEE COMPLETE 4+V*R*
4 series · 4 of 4 positions shown · non-contrast
Comparison: Left knee performed today

CLINICAL DATA: Right knee pain.  No known injury.

EXAM:
RIGHT KNEE - COMPLETE 4+ VIEW

[knee ap]
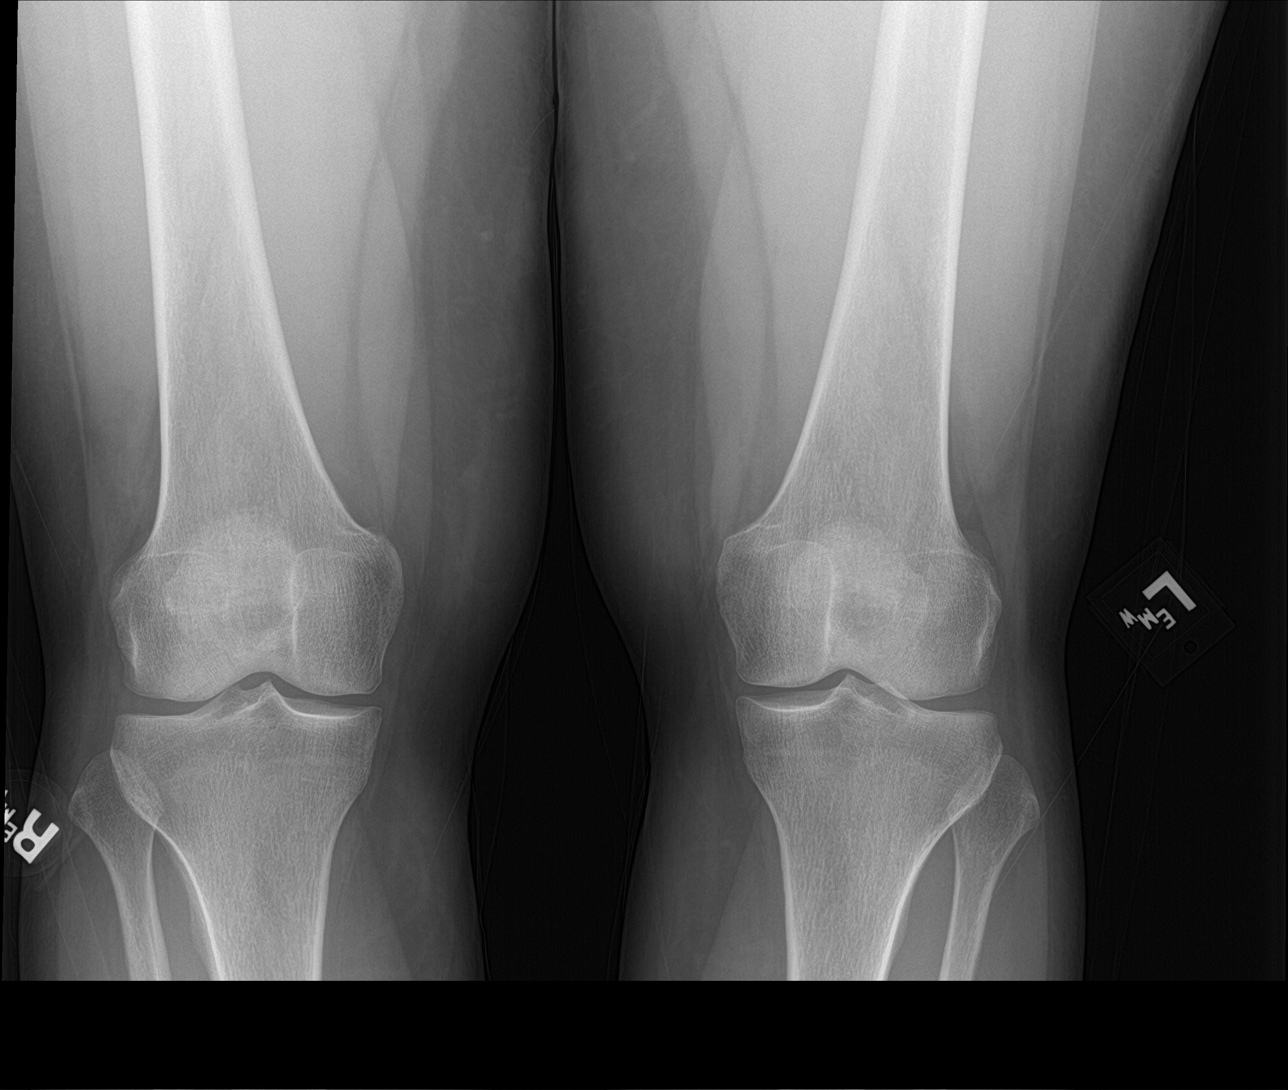

[tunnel]
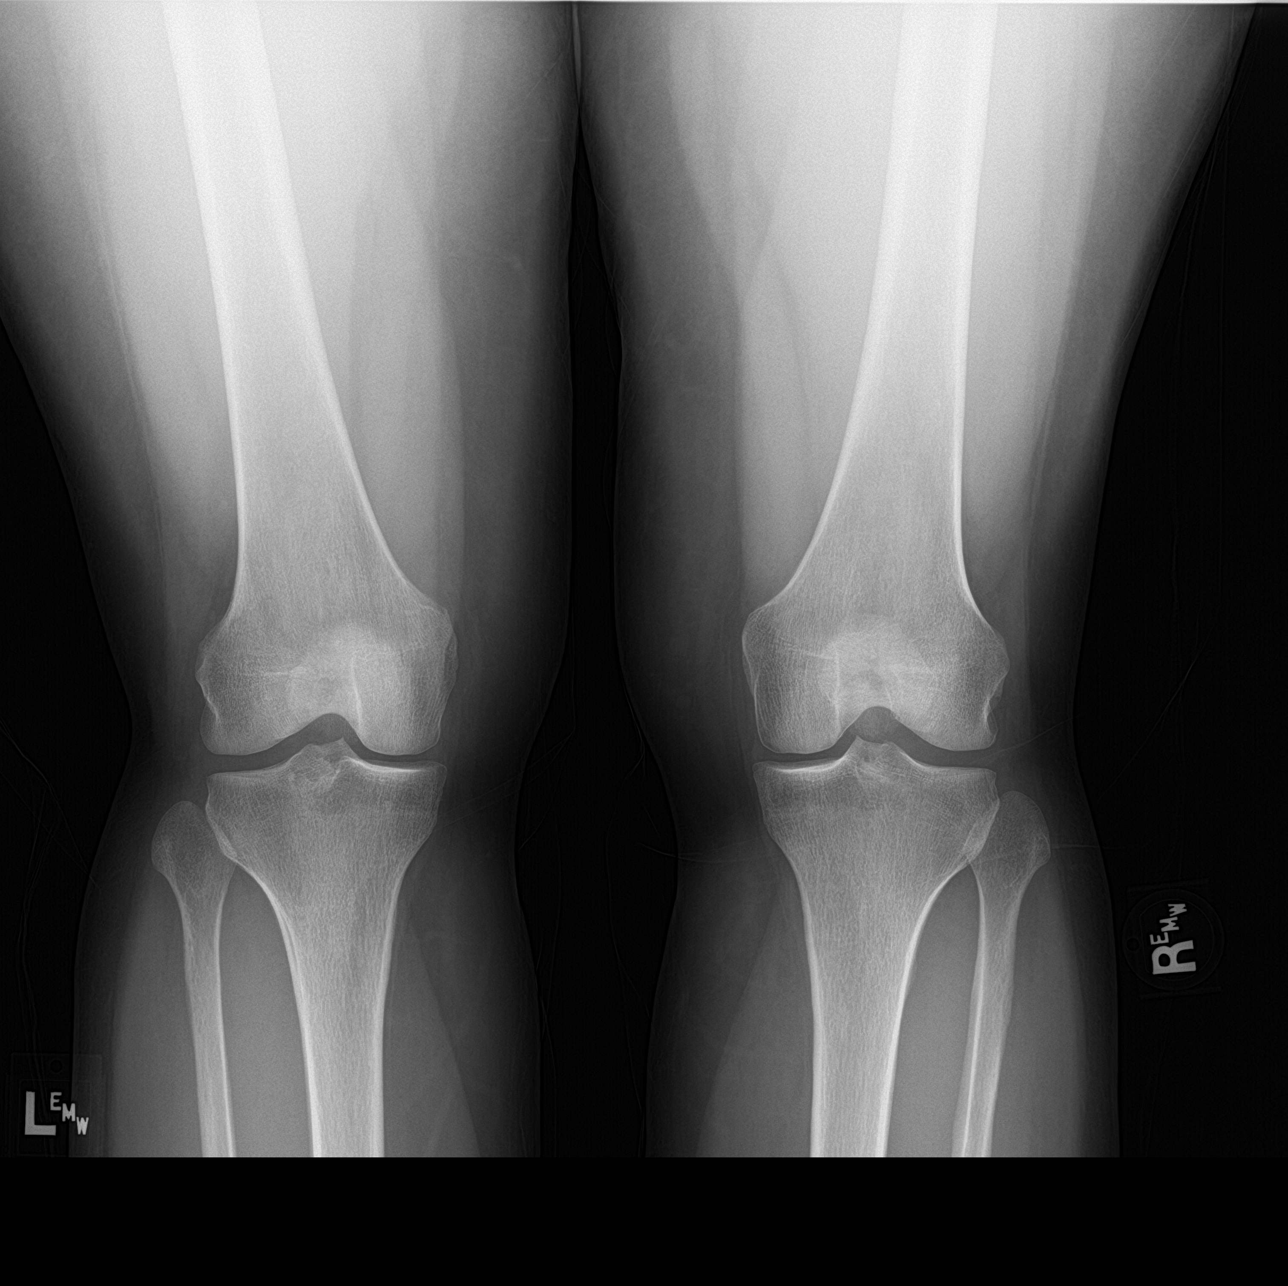

[knee lat]
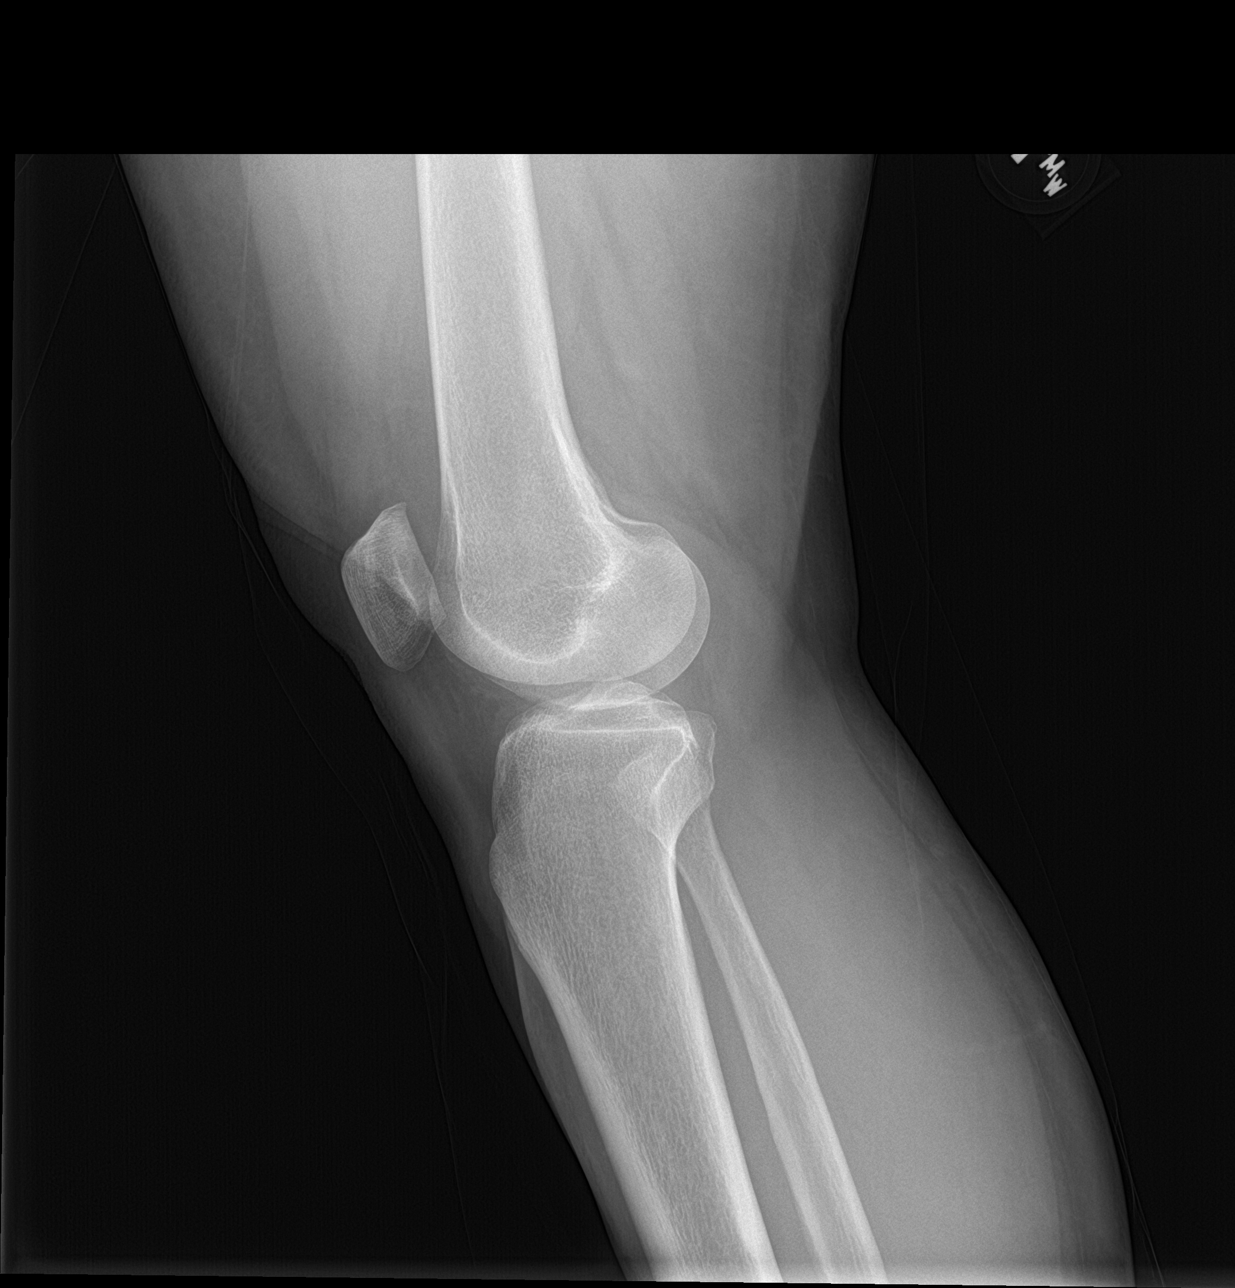

[knee sunrise]
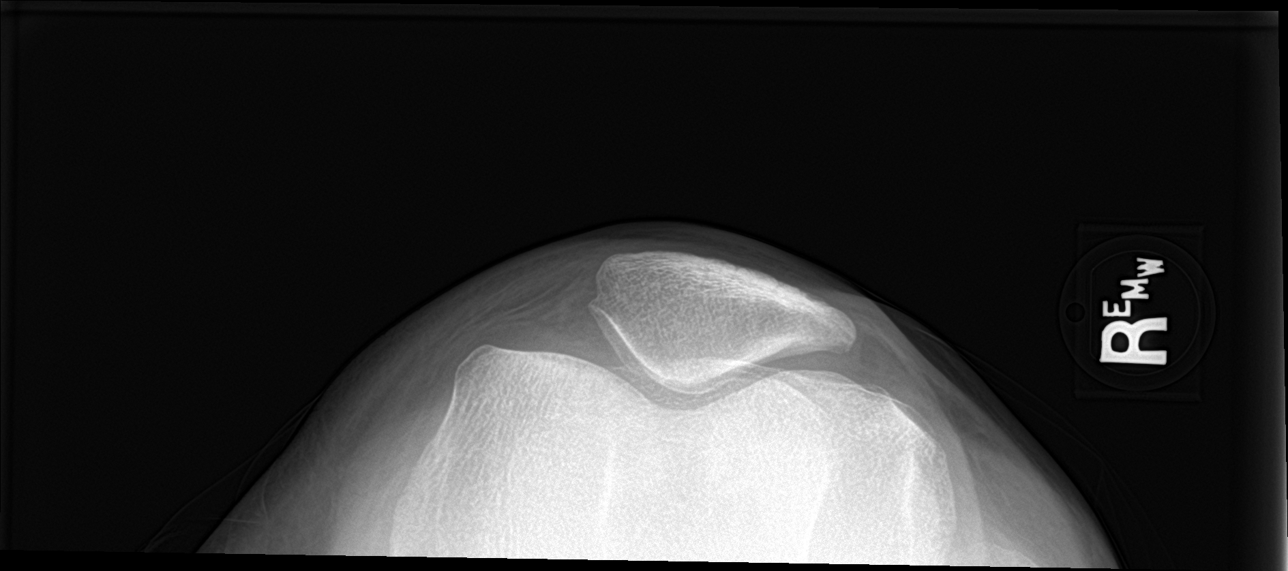

[4 of 4 positions shown; findings below may reference images not displayed]

FINDINGS: No evidence of fracture, dislocation, or joint effusion. No evidence
of arthropathy or other focal bone abnormality. Soft tissues are
unremarkable.
IMPRESSION: Negative.

## 2021-05-05 IMAGING — DX DG KNEE 1-2V*L*
4 series · 4 of 4 positions shown · non-contrast
Comparison: Right knee performed today.

CLINICAL DATA: Right knee pain.  For comparison to right knee

EXAM:
LEFT KNEE - 1-2 VIEW

[knee ap]
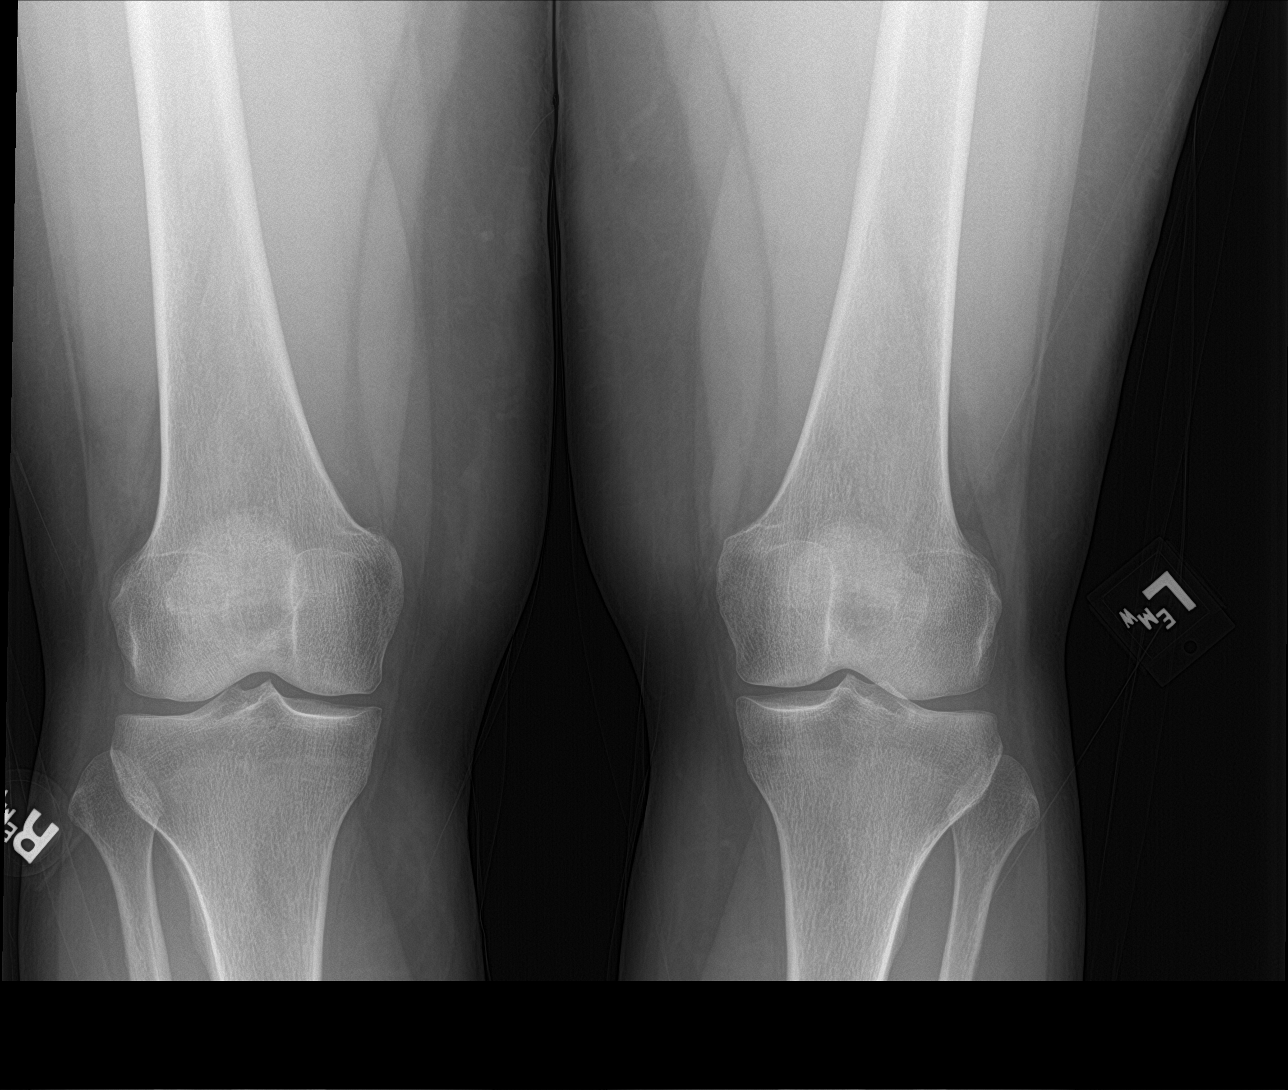

[tunnel]
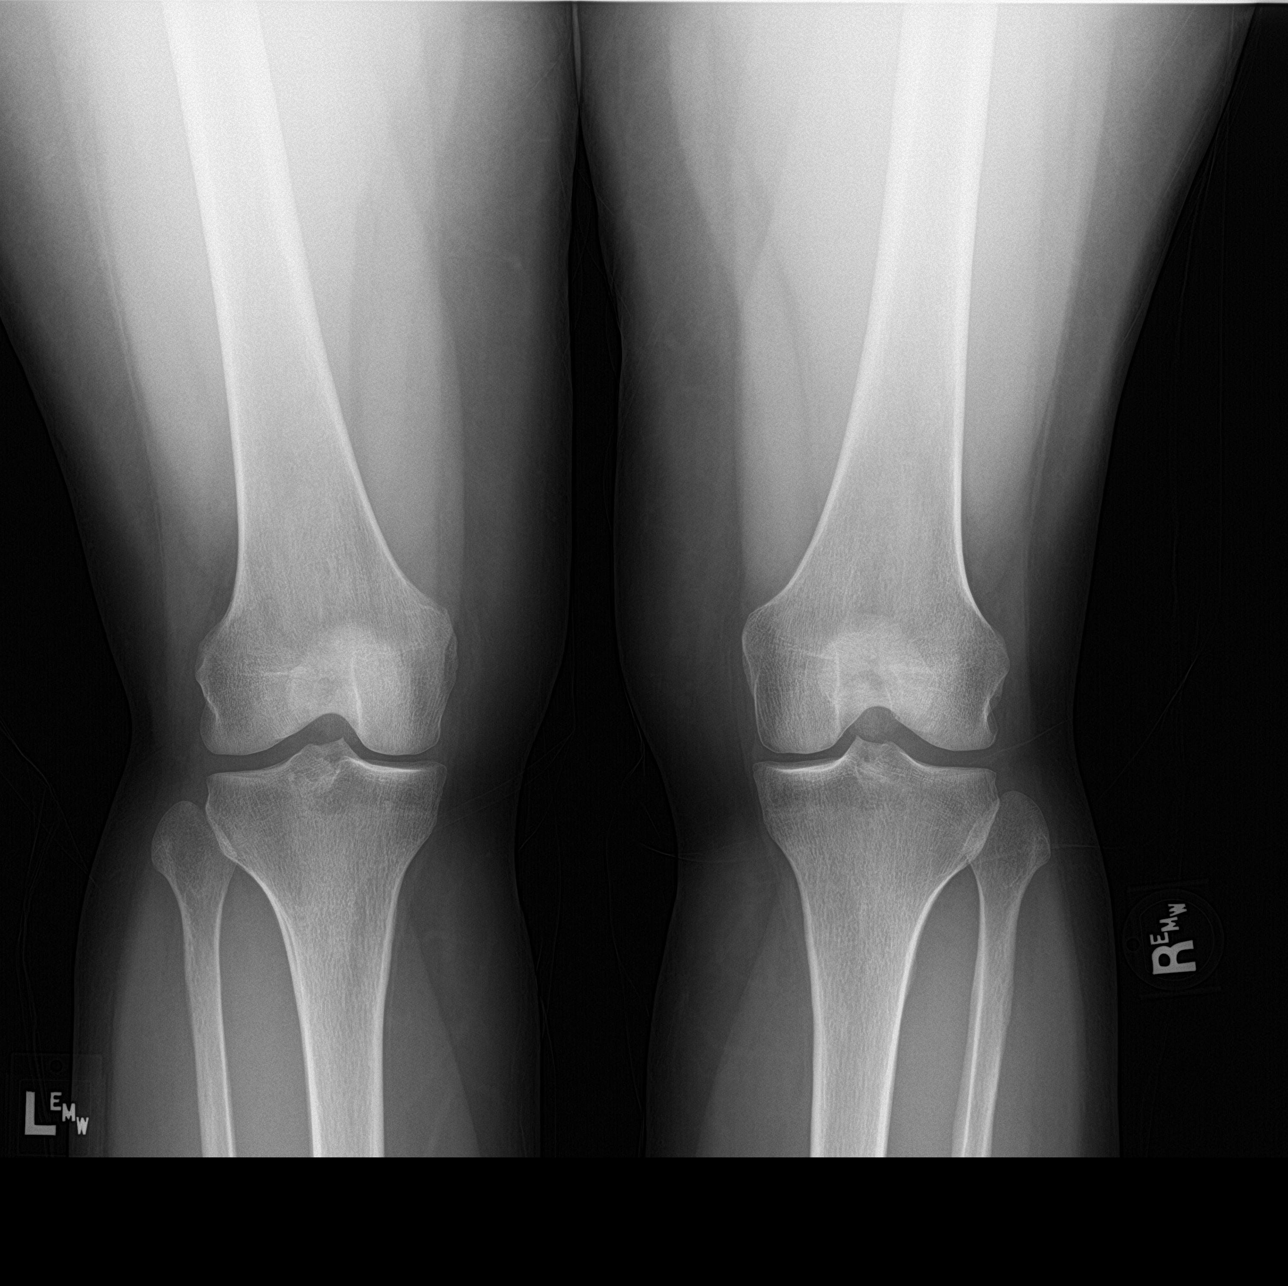

[knee lat]
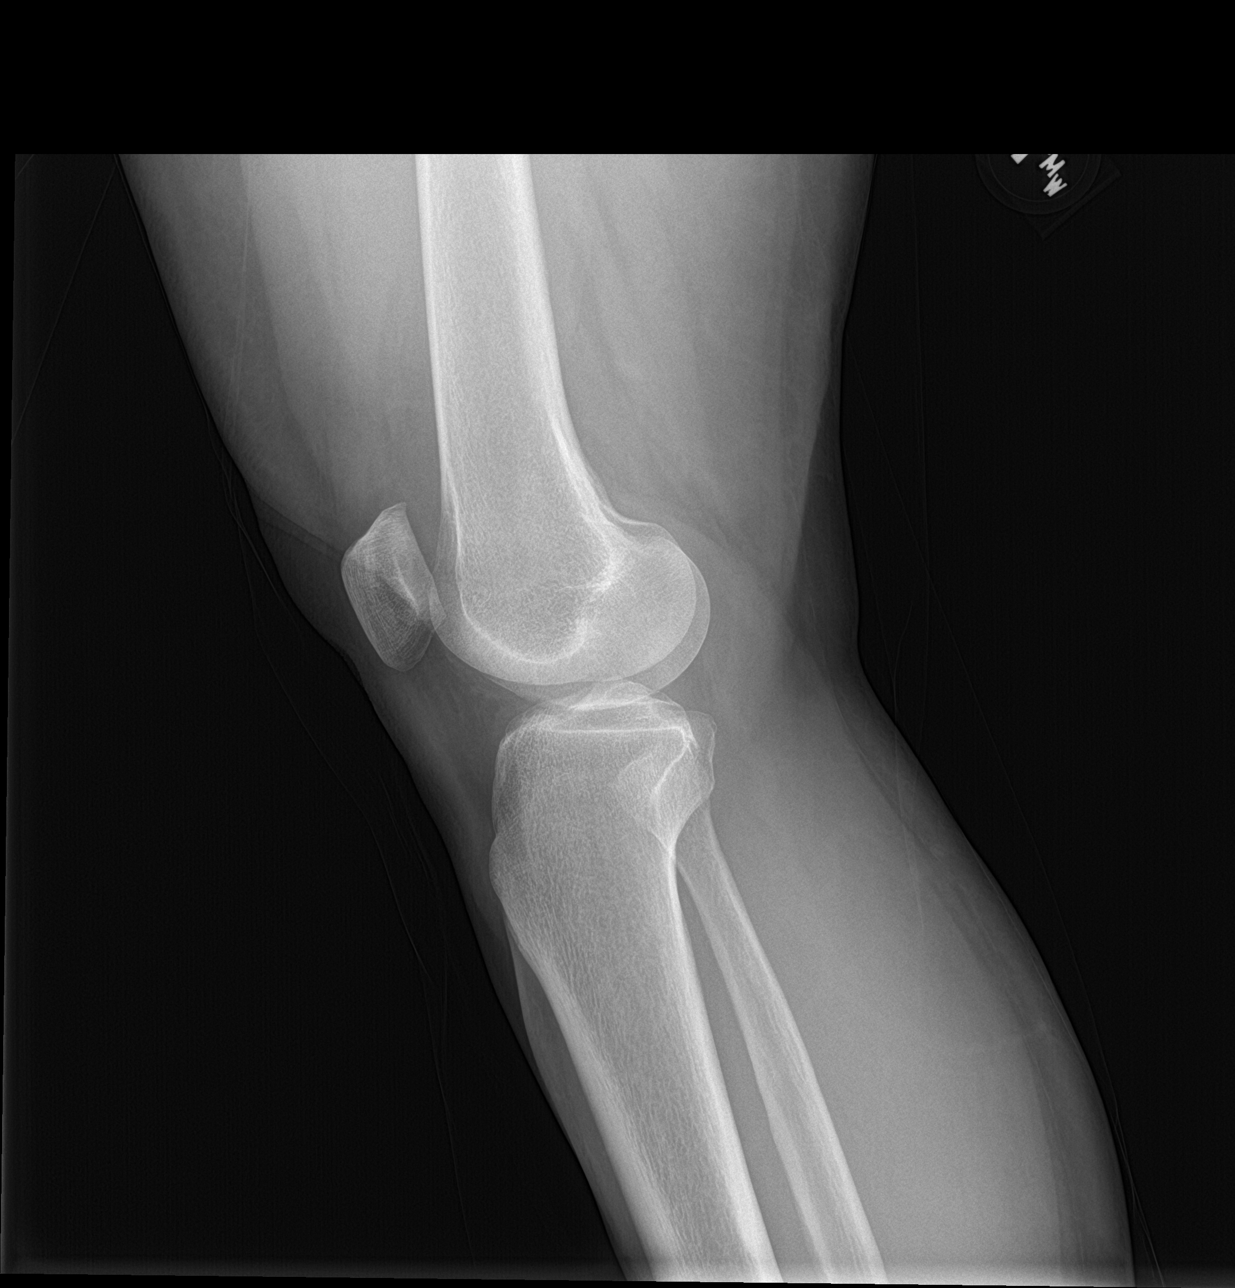

[knee sunrise]
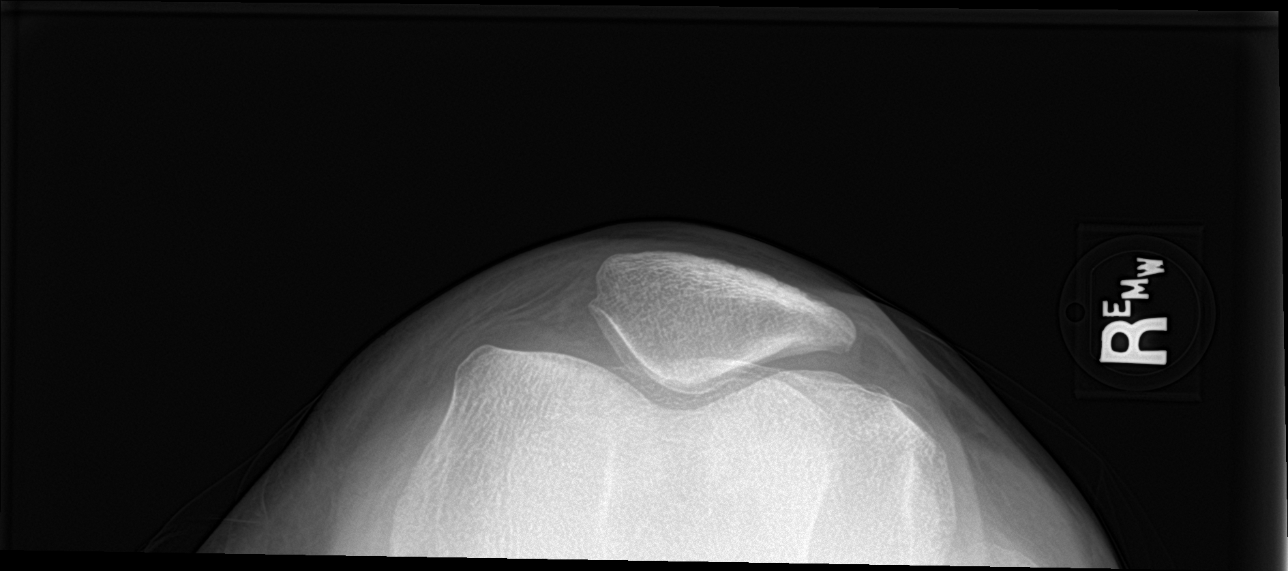

[4 of 4 positions shown; findings below may reference images not displayed]

FINDINGS: AP views of the left knee demonstrate no acute bony abnormality. No
fracture. Joint spaces maintained.
IMPRESSION: Negative.

## 2021-05-21 LAB — HM MAMMOGRAPHY

## 2021-07-01 ENCOUNTER — Encounter: Payer: Self-pay | Admitting: Internal Medicine

## 2021-07-01 ENCOUNTER — Ambulatory Visit (INDEPENDENT_AMBULATORY_CARE_PROVIDER_SITE_OTHER): Payer: Managed Care, Other (non HMO) | Admitting: Internal Medicine

## 2021-07-01 ENCOUNTER — Other Ambulatory Visit: Payer: Self-pay

## 2021-07-01 VITALS — BP 118/80 | HR 62 | Ht 65.0 in | Wt 161.4 lb

## 2021-07-01 DIAGNOSIS — N951 Menopausal and female climacteric states: Secondary | ICD-10-CM | POA: Diagnosis not present

## 2021-07-01 DIAGNOSIS — Z6826 Body mass index (BMI) 26.0-26.9, adult: Secondary | ICD-10-CM

## 2021-07-01 DIAGNOSIS — Z79899 Other long term (current) drug therapy: Secondary | ICD-10-CM

## 2021-07-01 NOTE — Patient Instructions (Signed)
Atrophic Vaginitis Atrophic vaginitis is a condition in which the tissues that line the vagina become dry and thin. This condition is most common in women who have stopped having regular menstrual periods (are in menopause). This usually starts when a woman is 45 to 52 years old. That is the time when a woman's estrogen levels begin to decrease. Estrogen is a female hormone. It helps to keep the tissues of the vagina moist. It stimulates the vagina to produce a clear fluid that lubricates the vagina for sex. This fluid also protects the vagina from infection. Lack of estrogen can cause the lining of the vagina to get thinner and dryer. The vagina may also shrink in size. It may become less elastic. Atrophic vaginitis tends to get worse over time as a woman's estrogen level drops. What are the causes? This condition is caused by the normal drop in estrogen that happens around the time of menopause. What increases the risk? Certain conditions or situations may lower a woman's estrogen level, leading to a higher risk for atrophic vaginitis. You are more likely to develop this condition if: You are taking medicines that block estrogen. You have had your ovaries removed. You are being treated for cancer with radiation or medicines (chemotherapy). You have given birth or are breastfeeding. You are older than age 50. You smoke. What are the signs or symptoms? Symptoms of this condition include: Pain, soreness, a feeling of pressure, or bleeding during sex (dyspareunia). Vaginal burning, irritation, or itching. Pain or bleeding when a speculum is used in a vaginal exam. Having burning pain while urinating. Vaginal discharge. In some cases, there are no symptoms. How is this diagnosed? This condition is diagnosed based on your medical history and a physical exam. This will include a pelvic exam that checks the vaginal tissues. Though rare, you may also have other tests, including: A urine test. A test  that checks the acid balance in your vagina (acid balance test). How is this treated? Treatment for this condition depends on how severe your symptoms are. Treatment may include: Using an over-the-counter vaginal lubricant before sex. Using a long-acting vaginal moisturizer. Using low-dose estrogen for moderate to severe symptoms that do not respond to other treatments. Options include creams, tablets, and inserts (vaginal rings). Before you use a vaginal estrogen, tell your health care provider if you have a history of: Breast cancer. Endometrial cancer. Blood clots. If you are not sexually active and your symptoms are very mild, you may not need treatment. Follow these instructions at home: Medicines Take over-the-counter and prescription medicines only as told by your health care provider. Do not use herbal or alternative medicines unless your health care provider says that you can. Use over-the-counter creams, lubricants, or moisturizers for dryness only as told by your health care provider. General instructions If your atrophic vaginitis is caused by menopause, discuss all of your menopause symptoms and treatment options with your health care provider. Do not douche. Do not use products that can make your vagina dry. These include: Scented feminine sprays. Scented tampons. Scented soaps. Vaginal sex can help to improve blood flow and elasticity of vaginal tissue. If you choose to have sex and it hurts, try using a water-soluble lubricant or moisturizer right before having sex. Contact a health care provider if: Your discharge looks different than normal. Your vagina has an unusual smell. You have new symptoms. Your symptoms do not improve with treatment. Your symptoms get worse. Summary Atrophic vaginitis is a condition in   which the tissues that line the vagina become dry and thin. It is most common in women who have stopped having regular menstrual periods (are in  menopause). Treatment options include using vaginal lubricants and low-dose vaginal estrogen. Contact a health care provider if your vagina has an unusual smell, or if your symptoms get worse or do not improve after treatment. This information is not intended to replace advice given to you by your health care provider. Make sure you discuss any questions you have with your health care provider. Document Revised: 12/07/2019 Document Reviewed: 12/07/2019 Elsevier Patient Education  2022 [employer] Inc.  

## 2021-07-01 NOTE — Progress Notes (Signed)
I,Victoria T Hamilton,acting as a scribe for [name], MD.,have documented all relevant documentation on the behalf of [name], MD,as directed by  [name], MD while in the presence of [name], MD.  This visit occurred during the SARS-CoV-2 public health emergency.  Safety protocols were in place, including screening questions prior to the visit, additional usage of staff PPE, and extensive cleaning of exam room while observing appropriate contact time as indicated for disinfecting solutions.  Subjective:     Patient ID: Tracy Franklin , female    DOB: [birth year] , 51 y.o.   MRN: [id number]   Chief Complaint  Patient presents with   BHRT    HPI  Pt presents today for BHRT f/u. She reports compliance with progesterone cream supplementation and vitamin E suppositories. She has no concerns or questions at the moment. She reports feeling well and w/o complaints of hot flashes. Vaginal dryness has improved with use of vitamin E suppositories.     Past Medical History:  Diagnosis Date   Atrophic vaginitis      Family History  Problem Relation Age of Onset   Cancer Father        prostate   Hyperlipidemia Father    Early death Mother    Asthma Mother      Current Outpatient Medications:    Calcium-Magnesium 250-125 MG TABS, Take by mouth., Disp: , Rfl:    Cholecalciferol (VITAMIN D) 125 MCG (5000 UT) CAPS, Take 5,000 Units by mouth. , Disp: , Rfl:    Multiple Vitamins-Minerals (MULTIVITAMIN WITH MINERALS) tablet, Take 1 tablet by mouth daily., Disp: , Rfl:    Progesterone Micronized (PROGESTERONE COMPOUNDING KIT TD), Place onto the skin., Disp: , Rfl:    VITAMIN E EX, Apply topically., Disp: , Rfl:    trimethoprim-polymyxin b (POLYTRIM) ophthalmic solution, Place 1 drop into the right eye every 4 (four) hours. For 7 days (Patient not taking: Reported on 07/01/2021), Disp: 10 mL, Rfl: 0   No Known Allergies   Review of Systems  Constitutional: Negative.    Respiratory: Negative.    Cardiovascular: Negative.   Gastrointestinal: Negative.   Neurological: Negative.   Psychiatric/Behavioral: Negative.      Today's Vitals   07/01/21 1021  BP: 118/80  Pulse: 62  Weight: 161 lb 6.4 oz (73.2 kg)  Height: _0  (1.651 m)   Body mass index is 26.86 kg/m.  Wt Readings from Last 3 Encounters:  07/01/21 161 lb 6.4 oz (73.2 kg)  12/16/20 172 lb 3.2 oz (78.1 kg)  08/13/20 164 lb 3.2 oz (74.5 kg)    Objective:  Physical Exam Vitals and nursing note reviewed.  Constitutional:      Appearance: Normal appearance.  HENT:     Head: Normocephalic and atraumatic.     Nose:     Comments: Masked     Mouth/Throat:     Comments: Masked  Eyes:     Extraocular Movements: Extraocular movements intact.  Cardiovascular:     Rate and Rhythm: Normal rate and regular rhythm.     Heart sounds: Normal heart sounds.  Pulmonary:     Effort: Pulmonary effort is normal.     Breath sounds: Normal breath sounds.  Musculoskeletal:     Cervical back: Normal range of motion.  Skin:    General: Skin is warm.  Neurological:     General: No focal deficit present.     Mental Status: She is alert.  Psychiatric:  Mood and Affect: Mood normal.        Behavior: Behavior normal.        Assessment And Plan:     1. Menopausal state Comments: She will c/w progesterone supplementation. Uses vitamin E vaginally for dryness. She will consider use of coconut oil vaginally as well. F/u in six months.  2. Body mass index (BMI) 26.0-26.9, adult Comments: She was congratulated on her 11 lb weight loss since June 2022. Encouraged to aim for at least 150 minutes of exercise per week.   3. Drug therapy - CMP14+EGFR   Patient was given opportunity to ask questions. Patient verbalized understanding of the plan and was able to repeat key elements of the plan. All questions were answered to their satisfaction.   I, [name], MD, have reviewed all documentation  for this visit. The documentation on 07/01/21 for the exam, diagnosis, procedures, and orders are all accurate and complete.   IF YOU HAVE BEEN REFERRED TO A SPECIALIST, IT MAY TAKE 1-2 WEEKS TO SCHEDULE/PROCESS THE REFERRAL. IF YOU HAVE NOT HEARD FROM US/SPECIALIST IN TWO WEEKS, PLEASE GIVE [place] A CALL AT [phone].   THE PATIENT IS ENCOURAGED TO PRACTICE SOCIAL DISTANCING DUE TO THE COVID-19 PANDEMIC.

## 2021-07-02 LAB — CMP14+EGFR
ALT: 18 IU/L (ref 0–32)
AST: 22 IU/L (ref 0–40)
Albumin/Globulin Ratio: 2 (ref 1.2–2.2)
Albumin: 4.9 g/dL (ref 3.8–4.9)
Alkaline Phosphatase: 75 IU/L (ref 44–121)
BUN/Creatinine Ratio: 17 (ref 9–23)
BUN: 15 mg/dL (ref 6–24)
Bilirubin Total: 0.3 mg/dL (ref 0.0–1.2)
CO2: 24 mmol/L (ref 20–29)
Calcium: 10.4 mg/dL — ABNORMAL HIGH (ref 8.7–10.2)
Chloride: 102 mmol/L (ref 96–106)
Creatinine, Ser: 0.86 mg/dL (ref 0.57–1.00)
Globulin, Total: 2.5 g/dL (ref 1.5–4.5)
Glucose: 88 mg/dL (ref 70–99)
Potassium: 4.7 mmol/L (ref 3.5–5.2)
Sodium: 141 mmol/L (ref 134–144)
Total Protein: 7.4 g/dL (ref 6.0–8.5)
eGFR: 82 mL/min/1.73

## 2022-01-06 ENCOUNTER — Ambulatory Visit: Payer: BC Managed Care – PPO | Admitting: Internal Medicine

## 2022-01-15 ENCOUNTER — Ambulatory Visit: Payer: BC Managed Care – PPO | Admitting: Internal Medicine

## 2022-02-18 ENCOUNTER — Encounter: Payer: Self-pay | Admitting: Internal Medicine

## 2022-02-18 ENCOUNTER — Ambulatory Visit (INDEPENDENT_AMBULATORY_CARE_PROVIDER_SITE_OTHER): Payer: Self-pay | Admitting: Internal Medicine

## 2022-02-18 VITALS — BP 110/60 | HR 73 | Temp 98.0°F | Ht 65.0 in | Wt 168.2 lb

## 2022-02-18 DIAGNOSIS — N951 Menopausal and female climacteric states: Secondary | ICD-10-CM

## 2022-02-18 DIAGNOSIS — Z79890 Hormone replacement therapy (postmenopausal): Secondary | ICD-10-CM | POA: Diagnosis not present

## 2022-02-18 DIAGNOSIS — N952 Postmenopausal atrophic vaginitis: Secondary | ICD-10-CM

## 2022-02-18 DIAGNOSIS — Z1159 Encounter for screening for other viral diseases: Secondary | ICD-10-CM

## 2022-02-18 DIAGNOSIS — Z23 Encounter for immunization: Secondary | ICD-10-CM

## 2022-02-18 DIAGNOSIS — Z6826 Body mass index (BMI) 26.0-26.9, adult: Secondary | ICD-10-CM | POA: Diagnosis not present

## 2022-02-18 MED ORDER — INTRAROSA 6.5 MG VA INST: VAGINAL_INSERT | VAGINAL | 2 refills | Status: DC

## 2022-02-18 NOTE — Progress Notes (Signed)
Tracy Franklin,acting as a Education administrator for Tracy Greenland, MD.,have documented all relevant documentation on the behalf of Tracy Greenland, MD,as directed by  Tracy Greenland, MD while in the presence of Tracy Greenland, MD.    Subjective:     Patient ID: Tracy Franklin , female    DOB: 05-Aug-1970 , 51 y.o.   MRN: [id number]   Chief Complaint  Patient presents with   hormones f/u    HPI  Pt presents today for BHRT f/u. She reports compliance with progesterone cream supplementation and vitamin E suppositories. She has no concerns or questions at the moment. She reports feeling well and w/o complaints of hot flashes.  She feels vit E suppositories are not as effective as they have been in the past.   Patient would like to talk more about the Shingrix vaccine.  BP Readings from Last 3 Encounters: 02/18/22 : 110/60 07/01/21 : 118/80 12/16/20 : 112/70       Past Medical History:  Diagnosis Date   Atrophic vaginitis      Family History  Problem Relation Age of Onset   Cancer Father        prostate   Hyperlipidemia Father    Early death Mother    Asthma Mother      Current Outpatient Medications:    Calcium-Magnesium 250-125 MG TABS, Take by mouth., Disp: , Rfl:    Cholecalciferol (VITAMIN D) 125 MCG (5000 UT) CAPS, Take 5,000 Units by mouth. , Disp: , Rfl:    Multiple Vitamins-Minerals (MULTIVITAMIN WITH MINERALS) tablet, Take 1 tablet by mouth daily., Disp: , Rfl:    Prasterone (INTRAROSA) 6.5 MG INST, Use 1 INTRAROSA vaginal insert, one time each day at bedtime, Disp: 30 each, Rfl: 2   Progesterone Micronized (PROGESTERONE COMPOUNDING KIT TD), Place onto the skin., Disp: , Rfl:    VITAMIN E EX, Apply topically., Disp: , Rfl:    diphenhydrAMINE (BENADRYL) 25 mg capsule, Take 25 mg by mouth every 6 (six) hours as needed., Disp: , Rfl:    ibuprofen (ADVIL) 200 MG tablet, Take 200 mg by mouth every 6 (six) hours as needed., Disp: , Rfl:    predniSONE (STERAPRED UNI-PAK 21  TAB) 10 MG (21) TBPK tablet, Take by mouth daily. Take 6 tabs by mouth daily  for 2 days, then 5 tabs for 2 days, then 4 tabs for 2 days, then 3 tabs for 2 days, 2 tabs for 2 days, then 1 tab by mouth daily for 2 days, Disp: 42 tablet, Rfl: 0   triamcinolone cream (KENALOG) 0.1 %, Apply 1 Application topically 2 (two) times daily., Disp: 15 g, Rfl: 0   No Known Allergies   Review of Systems  Constitutional: Negative.   Respiratory: Negative.    Cardiovascular: Negative.   Genitourinary:        She c/o vaginal dryness.   Neurological: Negative.   Psychiatric/Behavioral: Negative.       Today's Vitals   02/18/22 1443  BP: 110/60  Pulse: 73  Temp: 98 F (36.7 C)  TempSrc: Oral  Weight: 168 lb 3.2 oz (76.3 kg)  Height: _0  (1.651 m)  PainSc: 0-No pain   Body mass index is 27.99 kg/m.  Wt Readings from Last 3 Encounters:  03/01/22 155 lb (70.3 kg)  02/18/22 168 lb 3.2 oz (76.3 kg)  07/01/21 161 lb 6.4 oz (73.2 kg)    Objective:  Physical Exam Vitals and nursing note reviewed.  Constitutional:  Appearance: Normal appearance.  HENT:     Head: Normocephalic and atraumatic.  Eyes:     Extraocular Movements: Extraocular movements intact.  Cardiovascular:     Rate and Rhythm: Normal rate and regular rhythm.     Heart sounds: Normal heart sounds.  Pulmonary:     Effort: Pulmonary effort is normal.     Breath sounds: Normal breath sounds.  Musculoskeletal:     Cervical back: Normal range of motion.  Skin:    General: Skin is warm.  Neurological:     General: No focal deficit present.     Mental Status: She is alert.  Psychiatric:        Mood and Affect: Mood normal.        Behavior: Behavior normal.      Assessment And Plan:     1. 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  She will f/u in 4 months.   2. Atrophic vaginitis Comments: She has tried vitamin E suppositories w/o good results. She  agrees to try Intrarosa suppositories. I will send rx to the pharmacy. - Prasterone (INTRAROSA) 6.5 MG INST; Use 1 INTRAROSA vaginal insert, one time each day at bedtime  Dispense: 30 each; Refill: 2  3. Body mass index (BMI) 26.0-26.9, adult Comments: She is encouraged to aim for at least 150 minutes of exercise per week.   4. Post-menopause on HRT (hormone replacement therapy) - CMP14+EGFR  5. Encounter for HCV screening test for low risk patient - Hepatitis C antibody  6. Immunization due Comments: She was given Shingrix IMx 1.   Patient was given opportunity to ask questions. Patient verbalized understanding of the plan and was able to repeat key elements of the plan. All questions were answered to their satisfaction.   I, Tracy Greenland, MD, have reviewed all documentation for this visit. The documentation on 02/18/22 for the exam, diagnosis, procedures, and orders are all accurate and complete.   IF YOU HAVE BEEN REFERRED TO A SPECIALIST, IT MAY TAKE 1-2 WEEKS TO SCHEDULE/PROCESS THE REFERRAL. IF YOU HAVE NOT HEARD FROM US/SPECIALIST IN TWO WEEKS, PLEASE GIVE [place] A CALL AT [phone].   THE PATIENT IS ENCOURAGED TO PRACTICE SOCIAL DISTANCING DUE TO THE COVID-19 PANDEMIC.

## 2022-02-18 NOTE — Patient Instructions (Signed)

## 2022-02-19 LAB — CMP14+EGFR
ALT: 14 IU/L (ref 0–32)
AST: 19 IU/L (ref 0–40)
Albumin/Globulin Ratio: 1.8 (ref 1.2–2.2)
Albumin: 4.8 g/dL (ref 3.8–4.9)
Alkaline Phosphatase: 73 IU/L (ref 44–121)
BUN/Creatinine Ratio: 16 (ref 9–23)
BUN: 16 mg/dL (ref 6–24)
Bilirubin Total: 0.2 mg/dL (ref 0.0–1.2)
CO2: 25 mmol/L (ref 20–29)
Calcium: 9.9 mg/dL (ref 8.7–10.2)
Chloride: 103 mmol/L (ref 96–106)
Creatinine, Ser: 0.97 mg/dL (ref 0.57–1.00)
Globulin, Total: 2.7 g/dL (ref 1.5–4.5)
Glucose: 88 mg/dL (ref 70–99)
Potassium: 4.1 mmol/L (ref 3.5–5.2)
Sodium: 142 mmol/L (ref 134–144)
Total Protein: 7.5 g/dL (ref 6.0–8.5)
eGFR: 71 mL/min/{1.73_m2} (ref >59)

## 2022-02-19 LAB — HEPATITIS C ANTIBODY: Hep C Virus Ab: NONREACTIVE

## 2022-02-28 ENCOUNTER — Encounter: Payer: Self-pay | Admitting: Internal Medicine

## 2022-03-01 ENCOUNTER — Other Ambulatory Visit: Payer: Self-pay

## 2022-03-01 ENCOUNTER — Ambulatory Visit
Admission: EM | Admit: 2022-03-01 | Discharge: 2022-03-01 | Disposition: A | Discharge status: Home / Self Care | Payer: BC Managed Care – PPO

## 2022-03-01 DIAGNOSIS — L237 Allergic contact dermatitis due to plants, except food: Secondary | ICD-10-CM

## 2022-03-01 MED ORDER — METHYLPREDNISOLONE SODIUM SUCC 125 MG IJ SOLR
125.0000 mg | Freq: Once | INTRAMUSCULAR | Status: AC
  Administered 2022-03-01: 125 mg via INTRAMUSCULAR

## 2022-03-01 MED ORDER — PREDNISONE 10 MG (21) PO TBPK: ORAL_TABLET | Freq: Every day | ORAL | 0 refills | Status: DC

## 2022-03-01 NOTE — ED Triage Notes (Signed)
Pt presents to Urgent Care with c/o rash to R arm, face, neck, chest and abdomen which began 6 days ago, 5 days after getting the shingles vaccine. Pt states rash is itchy and painful.

## 2022-03-01 NOTE — Discharge Instructions (Addendum)
Instructed patient to start Tracy Franklin tomorrow morning Monday, 03/02/2022.  Advised patient to take medication as directed with food to completion.  Encouraged patient to increase daily water intake while taking this medication.  Advised patient to change bed linens for the next 3 nights to avoid recontamination.  Advised patient if symptoms worsen and/or unresolved please follow-up with PCP or here for further evaluation.

## 2022-03-01 NOTE — ED Provider Notes (Signed)
[facility]    CSN: [id number] Arrival date & time: 03/01/22  1330      History   Chief Complaint Chief Complaint  Patient presents with   Rash    HPI Tracy Franklin is a 51 y.o. female.   HPI 51 year old female presents with rash of right arm, face, neck chest, abdomen which began 6 days ago.  She reports rash erupted 5 days after getting shingles vaccine.  Patient reports rash is painful and itchy.  PMH significant for atrophic vaginitis, chronic left shoulder pain, and decreased platelet count.  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      Home Medications    Prior to Admission medications   Medication Sig Start Date End Date Taking? Authorizing Provider  diphenhydrAMINE (BENADRYL) 25 mg capsule Take 25 mg by mouth every 6 (six) hours as needed.   Yes [provider]  ibuprofen (ADVIL) 200 MG tablet Take 200 mg by mouth every 6 (six) hours as needed.   Yes [provider]  predniSONE (STERAPRED UNI-PAK 21 TAB) 10 MG (21) TBPK tablet Take by mouth daily. Take 6 tabs by mouth daily  for 2 days, then 5 tabs for 2 days, then 4 tabs for 2 days, then 3 tabs for 2 days, 2  tabs for 2 days, then 1 tab by mouth daily for 2 days 03/01/22  Yes Eliezer Lofts, FNP  Calcium-Magnesium 250-125 MG TABS Take by mouth.    [provider]  Cholecalciferol (VITAMIN D) 125 MCG (5000 UT) CAPS Take 5,000 Units by mouth.     [provider]  Multiple Vitamins-Minerals (MULTIVITAMIN WITH MINERALS) tablet Take 1 tablet by mouth daily.    [provider]  Prasterone [place]) 6.5 MG INST Use 1 INTRAROSA vaginal insert, one time each day at bedtime 02/18/22   Glendale Chard, MD  Progesterone Micronized (PROGESTERONE COMPOUNDING KIT TD) Place onto the skin.    [provider]  VITAMIN E EX Apply topically.    [provider]    Family History Family History  Problem Relation Age of Onset   Cancer Father        prostate   Hyperlipidemia Father    Early death Mother    Asthma Mother     Social History Social History   Tobacco Use   Smoking status: Never   Smokeless tobacco: Never  Vaping Use   Vaping Use: Never used  Substance Use Topics   Alcohol use: Yes    Comment: occasionally   Drug use: No     Allergies   Patient has no known allergies.   Review of Systems Review of Systems  Skin:  Positive for rash.  Physical Exam Triage Vital Signs ED Triage Vitals  Enc Vitals Group     BP 03/01/22 1352 139/84     Pulse Rate 03/01/22 1352 71     Resp 03/01/22 1352 20     Temp 03/01/22 1352 97.8 F (36.6 C)     Temp Source 03/01/22 1352 Oral     SpO2 03/01/22 1352 98 %     Weight 03/01/22 1349 155 lb (70.3 kg)     Height 03/01/22 1349 '5\' 5"'  (1.651 m)     Head Circumference --      Peak Flow --      Pain Score 03/01/22 1349 4     Pain Loc --      Pain Edu? --      Excl. in [place]? --    No data found.  Updated Vital Signs BP 139/84 (BP Location: Right Arm)   Pulse 71   Temp 97.8 F (36.6 C) (Oral)   Resp 20   Ht '5\' 5"'  (1.651 m)   Wt 155 lb (70.3 kg)   SpO2 98%   BMI 25.79 kg/m    Physical  Exam Vitals and nursing note reviewed.  Constitutional:      Appearance: Normal appearance.  HENT:     Head: Normocephalic and atraumatic.     Mouth/Throat:     Mouth: Mucous membranes are moist.     Pharynx: Oropharynx is clear.  Eyes:     Extraocular Movements: Extraocular movements intact.     Conjunctiva/sclera: Conjunctivae normal.     Pupils: Pupils are equal, round, and reactive to light.  Cardiovascular:     Rate and Rhythm: Normal rate and regular rhythm.     Pulses: Normal pulses.     Heart sounds: Normal heart sounds. No murmur heard. Pulmonary:     Effort: Pulmonary effort is normal.     Breath sounds: Normal breath sounds. No wheezing, rhonchi or rales.  Musculoskeletal:        General: Normal range of motion.     Cervical back: Normal range of motion and neck supple.  Skin:    General: Skin is warm and dry.     Comments: Face/upper chest/right lower arm: Painful, pruritic grouped erythematous maculopapular eruption-see images below  Neurological:     General: No focal deficit present.     Mental Status: She is alert and oriented to person, place, and time.           UC Treatments / Results  Labs (all labs ordered are listed, but only abnormal results are displayed) Labs Reviewed - No data to display  EKG   Radiology No results found.  Procedures Procedures (including critical care time)  Medications Ordered in UC Medications  methylPREDNISolone sodium succinate (SOLU-MEDROL) 125 mg/2 mL injection 125 mg (125 mg Intramuscular Given 03/01/22 1458)    Initial Impression / Assessment and Plan / UC Course  I have reviewed the triage vital signs and the nursing notes.  Pertinent labs & imaging results that were available during my care of the patient were reviewed by me and considered in my medical decision making (see chart for details).     MDM: 1.  Poison ivy dermatitis-IM Solu-Medrol 125 mg given once in clinic, Rx'd Sterapred Unipak.  Instructed patient to start Esther Hardy tomorrow morning Monday, 03/02/2022.  Advised patient to take medication as directed with food to completion.  Encouraged patient to increase daily water intake while taking this medication.  Advised patient to  change bed linens for the next 3 nights to avoid recontamination.  Advised patient if symptoms worsen and/or unresolved please follow-up with PCP or here for further evaluation.  Patient discharged home, hemodynamically stable. Final Clinical Impressions(s) / UC Diagnoses   Final diagnoses:  Poison ivy dermatitis     Discharge Instructions      Instructed patient to start Sterapred Unipak tomorrow morning Monday, 03/02/2022.  Advised patient to take medication as directed with food to completion.  Encouraged patient to increase daily water intake while taking this medication.  Advised patient to change bed linens for the next 3 nights to avoid recontamination.  Advised patient if symptoms worsen and/or unresolved please follow-up with PCP or here for further evaluation.     ED Prescriptions     Medication Sig Dispense Auth. Provider   predniSONE (STERAPRED UNI-PAK 21 TAB) 10 MG (21) TBPK tablet Take by mouth daily. Take 6 tabs by mouth daily  for 2 days, then 5 tabs for 2 days, then 4 tabs for 2 days, then 3 tabs for 2 days, 2 tabs for 2 days, then 1 tab by mouth daily for 2 days 42 tablet Eliezer Lofts, FNP      PDMP not reviewed this encounter.   Eliezer Lofts, [place] 03/01/22 1519

## 2022-03-02 ENCOUNTER — Telehealth: Payer: Self-pay | Admitting: Emergency Medicine

## 2022-03-02 NOTE — Telephone Encounter (Signed)
LMTRC.  Advised if doing well to disregard the call, any questions or concerns, feel free to contact the office. 

## 2022-03-03 ENCOUNTER — Ambulatory Visit
Admission: RE | Admit: 2022-03-03 | Discharge: 2022-03-03 | Disposition: A | Discharge status: Home / Self Care | Payer: BC Managed Care – PPO | Source: Ambulatory Visit | Attending: Family Medicine | Admitting: Family Medicine

## 2022-03-03 VITALS — BP 151/72 | HR 66 | Temp 98.2°F | Resp 18

## 2022-03-03 DIAGNOSIS — L237 Allergic contact dermatitis due to plants, except food: Secondary | ICD-10-CM | POA: Diagnosis not present

## 2022-03-03 DIAGNOSIS — J029 Acute pharyngitis, unspecified: Secondary | ICD-10-CM

## 2022-03-03 LAB — SARS CORONAVIRUS 2 BY RT PCR: SARS Coronavirus 2 by RT PCR: NEGATIVE

## 2022-03-03 MED ORDER — TRIAMCINOLONE ACETONIDE 0.1 % EX CREA: 1.0000 | TOPICAL_CREAM | Freq: Two times a day (BID) | CUTANEOUS | 0 refills | Status: DC

## 2022-03-03 NOTE — ED Triage Notes (Signed)
Pt c/o shortness of breath, sore throat and rash from poison ivy. Low appetite and stomach upset. Got exposed to poison ivy over a week ago, seen in UC on Sunday. Tx with prednisone and solumedrol. Last dose of prednisone was this am.

## 2022-03-03 NOTE — Discharge Instructions (Signed)
Change prednisone 10mg  schedule as folllows:  Beginning tomorrow, take 2 tabs twice daily with food for 4 days.  On the 5th day decrease to 2 tabs once daily for 3 days.  If your COVID-19 test is positive, isolate yourself for five days from today.  At the end of five days you may end isolation if your symptoms have cleared or improved, and you have not had a fever for 24 hours. At this time you should wear a mask for five more days when you are around others.

## 2022-03-03 NOTE — ED Provider Notes (Signed)
[name]    CSN: [id number] Arrival date & time: 03/03/22  1151      History   Chief Complaint Chief Complaint  Patient presents with   Shortness of Breath    APPT 12PM   Sore Throat   Rash    Poison Ivy    HPI Tracy Franklin is a 51 y.o. female.   Patient was treated for poison ivy two days ago with solumedrol and a tapering course of prednisone.  After taking the first two daily doses of prednisone 40m, she has developed shortness of breath, sore throat, decreased appetite, nausea (without vomiting), and chills.  She notes that her rash is somewhat better.  The history is provided by the patient.    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      Home Medications    Prior to Admission medications   Medication Sig Start Date End Date Taking? Authorizing Provider  triamcinolone cream (KENALOG) 0.1 % Apply 1 Application topically 2 (two) times daily. 03/03/22  Yes BKandra Nicolas MD  Calcium-Magnesium 250-125 MG TABS Take by mouth.    [provider]  Cholecalciferol (VITAMIN D) 125 MCG (5000 UT) CAPS Take 5,000 Units by mouth.     [provider]   diphenhydrAMINE (BENADRYL) 25 mg capsule Take 25 mg by mouth every 6 (six) hours as needed.    [provider]  ibuprofen (ADVIL) 200 MG tablet Take 200 mg by mouth every 6 (six) hours as needed.    [provider]  Multiple Vitamins-Minerals (MULTIVITAMIN WITH MINERALS) tablet Take 1 tablet by mouth daily.    [provider]  Prasterone ([place] 6.5 MG INST Use 1 INTRAROSA vaginal insert, one time each day at bedtime 02/18/22   SGlendale Chard MD  predniSONE (STERAPRED UNI-PAK 21 TAB) 10 MG (21) TBPK tablet Take by mouth daily. Take 6 tabs by mouth daily  for 2 days, then 5 tabs for 2 days, then 4 tabs for 2 days, then 3 tabs for 2 days, 2 tabs for 2 days, then 1 tab by mouth daily for 2 days 03/01/22   REliezer Lofts FNP  Progesterone Micronized (PROGESTERONE COMPOUNDING KIT TD) Place onto the skin.    [provider]  VITAMIN E EX Apply topically.    [provider]    Family History Family History  Problem Relation Age of Onset   Cancer Father        prostate   Hyperlipidemia Father    Early death Mother    Asthma Mother     Social History Social History   Tobacco Use   Smoking status: Never   Smokeless tobacco: Never  Vaping Use  Vaping Use: Never used  Substance Use Topics   Alcohol use: Yes    Comment: occasionally   Drug use: No     Allergies   Patient has no known allergies.   Review of Systems Review of Systems + sore throat No cough No pleuritic pain No wheezing NNo post-nasal drainage No sinus pain/pressure No itchy/red eyes No earache No hemoptysis + SOB No fever, + chills + nausea No vomiting No abdominal pain No diarrhea No urinary symptoms + skin rash, improving + fatigue No myalgias No headache   Physical Exam Triage Vital Signs ED Triage Vitals  Enc Vitals Group     BP 03/03/22 1258 (!) 151/72     Pulse Rate 03/03/22 1258 66     Resp 03/03/22 1258 18     Temp 03/03/22 1258 98.2 F  (36.8 C)     Temp Source 03/03/22 1258 Oral     SpO2 03/03/22 1258 100 %     Weight --      Height --      Head Circumference --      Peak Flow --      Pain Score 03/03/22 1259 6     Pain Loc --      Pain Edu? --      Excl. in [place]? --    No data found.  Updated Vital Signs BP (!) 151/72 (BP Location: Right Arm)   Pulse 66   Temp 98.2 F (36.8 C) (Oral)   Resp 18   SpO2 100%   Visual Acuity Right Eye Distance:   Left Eye Distance:   Bilateral Distance:    Right Eye Near:   Left Eye Near:    Bilateral Near:     Physical Exam Nursing notes and Vital Signs reviewed. Appearance:  Patient appears stated age, and in no acute distress Eyes:  Pupils are equal, round, and reactive to light and accomodation.  Extraocular movement is intact.  Conjunctivae are not inflamed  Ears:  Canals normal.  Tympanic membranes normal.  Nose:  Normal turbinates.  No sinus tenderness.   Pharynx:  Normal Neck:  Supple.  Mildly enlarged lateral nodes are present, tender to palpation on the left.  No tenderness of tonsillar nodes. Lungs:  Clear to auscultation.  Breath sounds are equal.  Moving air well. Heart:  Regular rate and rhythm without murmurs, rubs, or gallops.  Abdomen:  Nontender without masses or hepatosplenomegaly.  Bowel sounds are present.  No CVA or flank tenderness.  Extremities:  No edema.  Skin:  Resolving contact dermatitis.  UC Treatments / Results  Labs (all labs ordered are listed, but only abnormal results are displayed) Labs Reviewed  SARS CORONAVIRUS 2 BY RT PCR    EKG   Radiology No results found.  Procedures Procedures (including critical care time)  Medications Ordered in UC Medications - No data to display  Initial Impression / Assessment and Plan / UC Course  I have reviewed the triage vital signs and the nursing notes.  Pertinent labs & imaging results that were available during my care of the patient were reviewed by me and considered in my  medical decision making (see chart for details).    Patient is not tolerating well the higher doses of prednisone.  Change dose schedule as below. Rx for triamcinolone cream. Patient may be developing an early viral URI. COVID19 PCR pending. Followup with Family Doctor if not improved in one week.   Final Clinical Impressions(s) / UC  Diagnoses   Final diagnoses:  Allergic contact dermatitis due to plants, except food  Pharyngitis, unspecified etiology     Discharge Instructions      Change prednisone 69m schedule as folllows:  Beginning tomorrow, take 2 tabs twice daily with food for 4 days.  On the 5th day decrease to 2 tabs once daily for 3 days.  If your COVID-19 test is positive, isolate yourself for five days from today.  At the end of five days you may end isolation if your symptoms have cleared or improved, and you have not had a fever for 24 hours. At this time you should wear a mask for five more days when you are around others.            ED Prescriptions     Medication Sig Dispense Auth. Provider   triamcinolone cream (KENALOG) 0.1 % Apply 1 Application topically 2 (two) times daily. 15 g BKandra Nicolas MD         BKandra Nicolas MD 03/05/22 [phone]

## 2022-03-04 ENCOUNTER — Telehealth: Payer: Self-pay | Admitting: Emergency Medicine

## 2022-03-04 NOTE — Telephone Encounter (Signed)
Call to see how Laxmi was today. Voice mail left with any questions or concerns with a call back #.

## 2022-04-16 ENCOUNTER — Telehealth (INDEPENDENT_AMBULATORY_CARE_PROVIDER_SITE_OTHER): Payer: BC Managed Care – PPO | Admitting: Internal Medicine

## 2022-04-16 ENCOUNTER — Encounter: Payer: Self-pay | Admitting: Internal Medicine

## 2022-04-16 VITALS — Ht 65.0 in | Wt 155.0 lb

## 2022-04-16 DIAGNOSIS — N952 Postmenopausal atrophic vaginitis: Secondary | ICD-10-CM | POA: Diagnosis not present

## 2022-04-16 DIAGNOSIS — N951 Menopausal and female climacteric states: Secondary | ICD-10-CM | POA: Diagnosis not present

## 2022-04-16 DIAGNOSIS — Z6825 Body mass index (BMI) 25.0-25.9, adult: Secondary | ICD-10-CM

## 2022-04-16 MED ORDER — INTRAROSA 6.5 MG VA INST: VAGINAL_INSERT | VAGINAL | 1 refills | Status: DC

## 2022-04-16 NOTE — Progress Notes (Signed)
Virtual Visit via Video   This visit type was conducted due to national recommendations for restrictions regarding the COVID-19 Pandemic (e.g. social distancing) in an effort to limit this patient's exposure and mitigate transmission in our community.  Due to her co-morbid illnesses, this patient is at least at moderate risk for complications without adequate follow up.  This format is felt to be most appropriate for this patient at this time.  All issues noted in this document were discussed and addressed.  A limited physical exam was performed with this format.    This visit type was conducted due to national recommendations for restrictions regarding the COVID-19 Pandemic (e.g. social distancing) in an effort to limit this patient's exposure and mitigate transmission in our community.  Patients identity confirmed using two different identifiers.  This format is felt to be most appropriate for this patient at this time.  All issues noted in this document were discussed and addressed.  No physical exam was performed (except for noted visual exam findings with Video Visits).    Date:  04/21/2022   ID:  Tracy Franklin, DOB 05-05-1971, MRN [id number]  Patient Location:  Home  Provider location:   Office    Chief Complaint:  "I have BHRT f/u"  History of Present Illness:    Tracy Franklin is a 51 y.o. female who presents via video conferencing for a telehealth visit today.    The patient does not have symptoms concerning for COVID-19 infection (fever, chills, cough, or new shortness of breath).   She presents today for virtual visit. She prefers this method of contact due to COVID-19 pandemic.  She presents for BHRT f/u. She feels well on her current regimen. She was started on Intrarosa to address vaginal dryness.  She has had some improvement in her sx. She is still using Progesterone supplementation as well.    She has no other concerns at this time. She did develop rash after her last  Shingrix vaccine. She went to Urgent care twice, she was diagnosed with contact dermatitis due to plants (poison oak/ivy). She initially thought this was a reaction to the vaccine. Understandably so, she is hesitant to receive the second vaccine.        Past Medical History:  Diagnosis Date   Atrophic vaginitis    Past Surgical History:  Procedure Laterality Date   ABDOMINAL HYSTERECTOMY       Current Meds  Medication Sig   Calcium-Magnesium 250-125 MG TABS Take by mouth.   Cholecalciferol (VITAMIN D) 125 MCG (5000 UT) CAPS Take 5,000 Units by mouth.    ibuprofen (ADVIL) 200 MG tablet Take 200 mg by mouth every 6 (six) hours as needed.   Multiple Vitamins-Minerals (MULTIVITAMIN WITH MINERALS) tablet Take 1 tablet by mouth daily.   Progesterone Micronized (PROGESTERONE COMPOUNDING KIT TD) Place onto the skin.   VITAMIN E EX Apply topically.   [DISCONTINUED] Prasterone (INTRAROSA) 6.5 MG INST Use 1 INTRAROSA vaginal insert, one time each day at bedtime     Allergies:   Patient has no known allergies.   Social History   Tobacco Use   Smoking status: Never   Smokeless tobacco: Never  Vaping Use   Vaping Use: Never used  Substance Use Topics   Alcohol use: Yes    Comment: occasionally   Drug use: No     Family Hx: The patient's family history includes Asthma in her mother; Cancer in her father; Early death in her mother; Hyperlipidemia in her father.  ROS:   Please see the history of present illness.    Review of Systems  Constitutional: Negative.   Respiratory: Negative.    Cardiovascular: Negative.   Gastrointestinal: Negative.   Neurological: Negative.   Psychiatric/Behavioral: Negative.      All other systems reviewed and are negative.   Labs/Other Tests and Data Reviewed:    Recent Labs: 02/18/2022: ALT 14; BUN 16; Creatinine, Ser 0.97; Potassium 4.1; Sodium 142   Recent Lipid Panel Lab Results  Component Value Date/Time   CHOL 216 (H) 12/07/2017 10:34  AM   TRIG 68 12/07/2017 10:34 AM   HDL 80 12/07/2017 10:34 AM   CHOLHDL 2.7 12/07/2017 10:34 AM   LDLCALC 120 (H) 12/07/2017 10:34 AM    Wt Readings from Last 3 Encounters:  04/16/22 155 lb (70.3 kg)  03/01/22 155 lb (70.3 kg)  02/18/22 168 lb 3.2 oz (76.3 kg)     Exam:    Vital Signs:  Ht _0  (1.651 m)   Wt 155 lb (70.3 kg)   BMI 25.79 kg/m     Physical Exam Vitals and nursing note reviewed.  HENT:     Head: Normocephalic and atraumatic.  Eyes:     Extraocular Movements: Extraocular movements intact.  Pulmonary:     Effort: Pulmonary effort is normal.  Musculoskeletal:     Cervical back: Normal range of motion.  Neurological:     Mental Status: She is alert and oriented to person, place, and time.  Psychiatric:        Mood and Affect: Affect normal.     ASSESSMENT & PLAN:    1. 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 She will c/w current regimen of supplemental progesterone and vaginal Intrarosa.   2. Atrophic vaginitis Comments: She will continue with Intrarosa suppositories. I will send rx to the pharmacy. - Prasterone (INTRAROSA) 6.5 MG INST; Use 1 INTRAROSA vaginal insert, one time each day at bedtime  Dispense: 90 each; Refill: 1  3. BMI 25.0-25.9,adult Comments: She is encouraged to aim for at least 150 minutes of exercise per week.   COVID-19 Education: The signs and symptoms of COVID-19 were discussed with the patient and how to seek care for testing (follow up with PCP or arrange E-visit).  The importance of social distancing was discussed today.  Patient Risk:   After full review of this patients clinical status, I feel that they are at least moderate risk at this time.  Time:   Today, I have spent 14 minutes/ seconds with the patient with telehealth technology discussing above diagnoses.     Medication Adjustments/Labs and Tests Ordered: Current medicines are reviewed at length with the patient today.  Concerns  regarding medicines are outlined above.   Tests Ordered: No orders of the defined types were placed in this encounter.   Medication Changes: Meds ordered this encounter  Medications   Prasterone (INTRAROSA) 6.5 MG INST    Sig: Use 1 INTRAROSA vaginal insert, one time each day at bedtime    Dispense:  90 each    Refill:  1    Disposition:  Follow up in 3 month(s)  Signed, [name], MD

## 2022-04-16 NOTE — Patient Instructions (Signed)
Atrophic Vaginitis  Atrophic vaginitis is a condition in which the tissues that line the vagina become dry and thin. This condition is most common in women who have stopped having regular menstrual periods (are in menopause). This usually starts when a woman is 45 to 52 years old. That is the time when a woman's estrogen levels begin to decrease. Estrogen is a female hormone. It helps to keep the tissues of the vagina moist. It stimulates the vagina to produce a clear fluid that lubricates the vagina for sex. This fluid also protects the vagina from infection. Lack of estrogen can cause the lining of the vagina to get thinner and dryer. The vagina may also shrink in size. It may become less elastic. Atrophic vaginitis tends to get worse over time as a woman's estrogen level drops. What are the causes? This condition is caused by the normal drop in estrogen that happens around the time of menopause. What increases the risk? Certain conditions or situations may lower a woman's estrogen level, leading to a higher risk for atrophic vaginitis. You are more likely to develop this condition if: You are taking medicines that block estrogen. You have had your ovaries removed. You are being treated for cancer with radiation or medicines (chemotherapy). You have given birth or are breastfeeding. You are older than age 50. You smoke. What are the signs or symptoms? Symptoms of this condition include: Pain, soreness, a feeling of pressure, or bleeding during sex (dyspareunia). Vaginal burning, irritation, or itching. Pain or bleeding when a speculum is used in a vaginal exam. Having burning pain while urinating. Vaginal discharge. In some cases, there are no symptoms. How is this diagnosed? This condition is diagnosed based on your medical history and a physical exam. This will include a pelvic exam that checks the vaginal tissues. Though rare, you may also have other tests, including: A urine test. A  test that checks the acid balance in your vagina (acid balance test). How is this treated? Treatment for this condition depends on how severe your symptoms are. Treatment may include: Using an over-the-counter vaginal lubricant before sex. Using a long-acting vaginal moisturizer. Using low-dose estrogen for moderate to severe symptoms that do not respond to other treatments. Options include creams, tablets, and inserts (vaginal rings). Before you use a vaginal estrogen, tell your health care provider if you have a history of: Breast cancer. Endometrial cancer. Blood clots. If you are not sexually active and your symptoms are very mild, you may not need treatment. Follow these instructions at home: Medicines Take over-the-counter and prescription medicines only as told by your health care provider. Do not use herbal or alternative medicines unless your health care provider says that you can. Use over-the-counter creams, lubricants, or moisturizers for dryness only as told by your health care provider. General instructions If your atrophic vaginitis is caused by menopause, discuss all of your menopause symptoms and treatment options with your health care provider. Do not douche. Do not use products that can make your vagina dry. These include: Scented feminine sprays. Scented tampons. Scented soaps. Vaginal sex can help to improve blood flow and elasticity of vaginal tissue. If you choose to have sex and it hurts, try using a water-soluble lubricant or moisturizer right before having sex. Contact a health care provider if: Your discharge looks different than normal. Your vagina has an unusual smell. You have new symptoms. Your symptoms do not improve with treatment. Your symptoms get worse. Summary Atrophic vaginitis is a condition   in which the tissues that line the vagina become dry and thin. It is most common in women who have stopped having regular menstrual periods (are in  menopause). Treatment options include using vaginal lubricants and low-dose vaginal estrogen. Contact a health care provider if your vagina has an unusual smell, or if your symptoms get worse or do not improve after treatment. This information is not intended to replace advice given to you by your health care provider. Make sure you discuss any questions you have with your health care provider. Document Revised: 12/07/2019 Document Reviewed: 12/07/2019 Elsevier Patient Education  2023 [employer]vier Inc.  

## 2022-04-21 ENCOUNTER — Encounter: Payer: Self-pay | Admitting: Internal Medicine

## 2022-04-21 DIAGNOSIS — N952 Postmenopausal atrophic vaginitis: Secondary | ICD-10-CM | POA: Insufficient documentation

## 2022-04-21 DIAGNOSIS — Z6825 Body mass index (BMI) 25.0-25.9, adult: Secondary | ICD-10-CM | POA: Insufficient documentation

## 2022-05-07 LAB — HM MAMMOGRAPHY

## 2022-07-21 ENCOUNTER — Encounter: Payer: BC Managed Care – PPO | Admitting: Internal Medicine

## 2022-08-31 ENCOUNTER — Ambulatory Visit: Payer: BC Managed Care – PPO | Admitting: Sports Medicine

## 2022-08-31 ENCOUNTER — Ambulatory Visit (INDEPENDENT_AMBULATORY_CARE_PROVIDER_SITE_OTHER): Payer: BC Managed Care – PPO

## 2022-08-31 DIAGNOSIS — M7061 Trochanteric bursitis, right hip: Secondary | ICD-10-CM | POA: Diagnosis not present

## 2022-08-31 MED ORDER — HYDROCODONE-ACETAMINOPHEN 5-325 MG PO TABS: 1.0000 | ORAL_TABLET | Freq: Three times a day (TID) | ORAL | 0 refills | Status: DC | PRN

## 2022-08-31 MED ORDER — TRIAMCINOLONE ACETONIDE 40 MG/ML IJ SUSP
40.0000 mg | Freq: Once | INTRAMUSCULAR | Status: AC
  Administered 2022-08-31: 40 mg via INTRAMUSCULAR

## 2022-08-31 NOTE — Assessment & Plan Note (Signed)
Very pleasant 52 year old female, increasing pain right lateral hip, tenderness directly to palpation. Unfortunately oral NSAIDs are ineffective. She is also got significant weakness to abduction on the right hip, today we did a trochanteric bursa injection, she will do formal physical therapy for hip abductor strengthening, hydrocodone for immediate pain relief. Return to see me in 6 weeks.

## 2022-08-31 NOTE — Progress Notes (Signed)
    Procedures performed today:    Procedure: Real-time Ultrasound Guided injection of the right greater trochanteric bursa Device: Samsung HS60  Verbal informed consent obtained.  Time-out conducted.  Noted no overlying erythema, induration, or other signs of local infection.  Skin prepped in a sterile fashion.  Local anesthesia: Topical Ethyl chloride.  With sterile technique and under real time ultrasound guidance: Noted noted normal-appearing hip abductors, 1 cc Kenalog 40, 2 cc lidocaine, 2 cc bupivacaine injected easily Completed without difficulty  Advised to call if fevers/chills, erythema, induration, drainage, or persistent bleeding.  Images permanently stored and available for review in PACS.  Impression: Technically successful ultrasound guided injection.  Independent interpretation of notes and tests performed by another provider:   None.  Brief History, Exam, Impression, and Recommendations:    Trochanteric bursitis, right hip Very pleasant 52 year old female, increasing pain right lateral hip, tenderness directly to palpation. Unfortunately oral NSAIDs are ineffective. She is also got significant weakness to abduction on the right hip, today we did a trochanteric bursa injection, she will do formal physical therapy for hip abductor strengthening, hydrocodone for immediate pain relief. Return to see me in 6 weeks.    ____________________________________________ Gwen Her. Dianah Field, M.D., ABFM., CAQSM., AME. Primary Care and Sports Medicine [facility]  Adjunct Professor of [employer]  Risk manager

## 2022-09-09 ENCOUNTER — Ambulatory Visit: Payer: BC Managed Care – PPO | Admitting: Rehabilitative and Restorative Service Providers"

## 2022-09-28 ENCOUNTER — Encounter: Payer: Self-pay | Admitting: Internal Medicine

## 2022-09-28 ENCOUNTER — Telehealth (INDEPENDENT_AMBULATORY_CARE_PROVIDER_SITE_OTHER): Payer: BC Managed Care – PPO | Admitting: Internal Medicine

## 2022-09-28 DIAGNOSIS — N952 Postmenopausal atrophic vaginitis: Secondary | ICD-10-CM | POA: Diagnosis not present

## 2022-09-28 DIAGNOSIS — Z79899 Other long term (current) drug therapy: Secondary | ICD-10-CM | POA: Diagnosis not present

## 2022-09-28 DIAGNOSIS — N951 Menopausal and female climacteric states: Secondary | ICD-10-CM | POA: Diagnosis not present

## 2022-09-28 MED ORDER — INTRAROSA 6.5 MG VA INST: VAGINAL_INSERT | VAGINAL | 1 refills | Status: AC

## 2022-09-28 NOTE — Progress Notes (Unsigned)
Virtual Visit via Video   This visit type was conducted due to national recommendations for restrictions regarding the COVID-19 Pandemic (e.g. social distancing) in an effort to limit this patient's exposure and mitigate transmission in our community.  Due to her co-morbid illnesses, this patient is at least at moderate risk for complications without adequate follow up.  This format is felt to be most appropriate for this patient at this time.  All issues noted in this document were discussed and addressed.  A limited physical exam was performed with this format.    This visit type was conducted due to national recommendations for restrictions regarding the COVID-19 Pandemic (e.g. social distancing) in an effort to limit this patient's exposure and mitigate transmission in our community.  Patients identity confirmed using two different identifiers.  This format is felt to be most appropriate for this patient at this time.  All issues noted in this document were discussed and addressed.  No physical exam was performed (except for noted visual exam findings with Video Visits).    Date:  10/06/2022   ID:  Tracy Artisara Weide, DOB 13-Jul-1970, MRN [id number]  Patient Location:  Home  Provider location:   Office    Chief Complaint:  "I have BHRT f/u"  History of Present Illness:    Tracy Franklin is a 52 y.o. female who presents via video conferencing for a telehealth visit today.    The patient does not have symptoms concerning for COVID-19 infection (fever, chills, cough, or new shortness of breath).   She presents today for virtual visit. She prefers this method of contact due to COVID-19 pandemic.  She presents for BHRT f/u. She feels well on her current regimen. She was started on Intrarosa to address vaginal dryness.  She has had some improvement in her sx. She is still using Progesterone supplementation as well.           Past Medical History:  Diagnosis Date   Atrophic vaginitis     Past Surgical History:  Procedure Laterality Date   ABDOMINAL HYSTERECTOMY       Current Meds  Medication Sig   Calcium-Magnesium 250-125 MG TABS Take by mouth.   Cholecalciferol (VITAMIN D) 125 MCG (5000 UT) CAPS Take 5,000 Units by mouth.    ibuprofen (ADVIL) 200 MG tablet Take 200 mg by mouth every 6 (six) hours as needed.   Multiple Vitamins-Minerals (MULTIVITAMIN WITH MINERALS) tablet Take 1 tablet by mouth daily.   Progesterone Micronized (PROGESTERONE COMPOUNDING KIT TD) Place onto the skin.   VITAMIN E EX Apply topically.   [DISCONTINUED] Prasterone (INTRAROSA) 6.5 MG INST Use 1 INTRAROSA vaginal insert, one time each day at bedtime     Allergies:   Patient has no known allergies.   Social History   Tobacco Use   Smoking status: Never   Smokeless tobacco: Never  Vaping Use   Vaping Use: Never used  Substance Use Topics   Alcohol use: Yes    Comment: occasionally   Drug use: No     Family Hx: The patient's family history includes Asthma in her mother; Cancer in her father; Early death in her mother; Hyperlipidemia in her father.  ROS:   Please see the history of present illness.    Review of Systems  Constitutional: Negative.   Respiratory: Negative.    Cardiovascular: Negative.   Gastrointestinal: Negative.   Neurological: Negative.   Psychiatric/Behavioral: Negative.      All other systems reviewed and are negative.  Labs/Other Tests and Data Reviewed:    Recent Labs: 02/18/2022: ALT 14; BUN 16; Creatinine, Ser 0.97; Potassium 4.1; Sodium 142   Recent Lipid Panel Lab Results  Component Value Date/Time   CHOL 216 (H) 12/07/2017 10:34 AM   TRIG 68 12/07/2017 10:34 AM   HDL 80 12/07/2017 10:34 AM   CHOLHDL 2.7 12/07/2017 10:34 AM   LDLCALC 120 (H) 12/07/2017 10:34 AM    Wt Readings from Last 3 Encounters:  04/16/22 155 lb (70.3 kg)  03/01/22 155 lb (70.3 kg)  02/18/22 168 lb 3.2 oz (76.3 kg)     Exam:    Vital Signs:  There were no  vitals taken for this visit.    Physical Exam Vitals and nursing note reviewed.  HENT:     Head: Normocephalic and atraumatic.     Nose:     Comments: Sounds congested Eyes:     Extraocular Movements: Extraocular movements intact.  Pulmonary:     Effort: Pulmonary effort is normal.  Musculoskeletal:     Cervical back: Normal range of motion.  Neurological:     Mental Status: She is alert and oriented to person, place, and time.  Psychiatric:        Mood and Affect: Affect normal.     ASSESSMENT & PLAN:    1. 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 She will f/u in 4 months.  2. Atrophic vaginitis Comments: She will continue with Intrarosa suppositories. I will send rx to the pharmacy. - Prasterone (INTRAROSA) 6.5 MG INST; Use 1 INTRAROSA vaginal insert, one time each day at bedtime  Dispense: 90 each; Refill: 1  3. Drug therapy - CMP14+EGFR; Future - CBC; Future - Lipid panel; Future    COVID-19 Education: The signs and symptoms of COVID-19 were discussed with the patient and how to seek care for testing (follow up with PCP or arrange E-visit).  The importance of social distancing was discussed today.  Patient Risk:   After full review of this patients clinical status, I feel that they are at least moderate risk at this time.  Time:   Today, I have spent 20 minutes/ seconds with the patient with telehealth technology discussing above diagnoses.     Medication Adjustments/Labs and Tests Ordered: Current medicines are reviewed at length with the patient today.  Concerns regarding medicines are outlined above.   Tests Ordered: Orders Placed This Encounter  Procedures   CMP14+EGFR   CBC   Lipid panel    Medication Changes: Meds ordered this encounter  Medications   Prasterone (INTRAROSA) 6.5 MG INST    Sig: Use 1 INTRAROSA vaginal insert, one time each day at bedtime    Dispense:  90 each    Refill:  1     Disposition:  Follow up in 4 month(s)  Signed, Gwynneth Alimentobyn N Savanna Dooley, MD

## 2022-10-07 ENCOUNTER — Encounter: Payer: Self-pay | Admitting: Internal Medicine

## 2022-10-07 ENCOUNTER — Ambulatory Visit: Payer: BC Managed Care – PPO | Admitting: Sports Medicine

## 2022-10-08 ENCOUNTER — Other Ambulatory Visit: Payer: BC Managed Care – PPO

## 2022-10-08 DIAGNOSIS — Z79899 Other long term (current) drug therapy: Secondary | ICD-10-CM

## 2022-10-09 LAB — CBC
Hematocrit: 42.6 % (ref 34.0–46.6)
Hemoglobin: 13.3 g/dL (ref 11.1–15.9)
MCH: 28.9 pg (ref 26.6–33.0)
MCHC: 31.2 g/dL — ABNORMAL LOW (ref 31.5–35.7)
MCV: 93 fL (ref 79–97)
Platelets: 218 10*3/uL (ref 150–450)
RBC: 4.6 x10E6/uL (ref 3.77–5.28)
RDW: 12.5 % (ref 11.7–15.4)
WBC: 4.6 10*3/uL (ref 3.4–10.8)

## 2022-10-09 LAB — CMP14+EGFR
ALT: 18 IU/L (ref 0–32)
AST: 16 IU/L (ref 0–40)
Albumin/Globulin Ratio: 1.8 (ref 1.2–2.2)
Albumin: 4.4 g/dL (ref 3.8–4.9)
Alkaline Phosphatase: 61 IU/L (ref 44–121)
BUN/Creatinine Ratio: 18 (ref 9–23)
BUN: 15 mg/dL (ref 6–24)
Bilirubin Total: 0.3 mg/dL (ref 0.0–1.2)
CO2: 22 mmol/L (ref 20–29)
Calcium: 9.5 mg/dL (ref 8.7–10.2)
Chloride: 105 mmol/L (ref 96–106)
Creatinine, Ser: 0.83 mg/dL (ref 0.57–1.00)
Globulin, Total: 2.4 g/dL (ref 1.5–4.5)
Glucose: 85 mg/dL (ref 70–99)
Potassium: 4.7 mmol/L (ref 3.5–5.2)
Sodium: 143 mmol/L (ref 134–144)
Total Protein: 6.8 g/dL (ref 6.0–8.5)
eGFR: 85 mL/min/{1.73_m2} (ref >59)

## 2022-10-09 LAB — LIPID PANEL
Chol/HDL Ratio: 2.6 ratio (ref 0.0–4.4)
Cholesterol, Total: 202 mg/dL — ABNORMAL HIGH (ref 100–199)
HDL: 79 mg/dL (ref >39)
LDL Chol Calc (NIH): 112 mg/dL — ABNORMAL HIGH (ref 0–99)
Triglycerides: 61 mg/dL (ref 0–149)
VLDL Cholesterol Cal: 11 mg/dL (ref 5–40)

## 2022-10-13 ENCOUNTER — Ambulatory Visit: Payer: BC Managed Care – PPO | Admitting: Sports Medicine

## 2022-10-15 ENCOUNTER — Ambulatory Visit: Payer: BC Managed Care – PPO | Admitting: Sports Medicine

## 2022-11-03 ENCOUNTER — Telehealth: Payer: Self-pay | Admitting: Internal Medicine

## 2022-11-03 ENCOUNTER — Ambulatory Visit: Payer: BC Managed Care – PPO | Admitting: Sports Medicine

## 2022-11-03 DIAGNOSIS — M7061 Trochanteric bursitis, right hip: Secondary | ICD-10-CM | POA: Diagnosis not present

## 2022-11-03 NOTE — Assessment & Plan Note (Signed)
Tracy Franklin returns, she is a very pleasant 52 year old female, I saw her approximately 2 months ago, severe right lateral hip pain, we diagnosed her with trochanteric bursitis and did a trochanteric bursal/hip abductor tendon injection, she did some formal PT, she improved dramatically. She was doing well until a trip to [place], she had a massage where the masseuse pressed aggressively on the anterior aspect of her right thigh, she had immediate pain. It is starting to improve, on exam she does have findings consistent with a hip flexor and vastus lateralis strain. She does not have any tenderness over the greater trochanter, adding hip flexor conditioning, she will return to see me as needed.

## 2022-11-03 NOTE — Progress Notes (Signed)
    Procedures performed today:    None.  Independent interpretation of notes and tests performed by another provider:   None.  Brief History, Exam, Impression, and Recommendations:    Trochanteric bursitis, right hip Tracy Franklin returns, she is a very pleasant 52 year old female, I saw her approximately 2 months ago, severe right lateral hip pain, we diagnosed her with trochanteric bursitis and did a trochanteric bursal/hip abductor tendon injection, she did some formal PT, she improved dramatically. She was doing well until a trip to [place], she had a massage where the masseuse pressed aggressively on the anterior aspect of her right thigh, she had immediate pain. It is starting to improve, on exam she does have findings consistent with a hip flexor and vastus lateralis strain. She does not have any tenderness over the greater trochanter, adding hip flexor conditioning, she will return to see me as needed.    ____________________________________________ Ihor Austin. Benjamin Stain, M.D., ABFM., CAQSM., AME. Primary Care and Sports Medicine [facility]  Adjunct Professor of Family Medicine  [employer] [employer] [facility] [employer]  Restaurant manager, fast food

## 2022-11-03 NOTE — Telephone Encounter (Signed)
Patient would like to re establish care. Patient was last seen on 06/27/2018.

## 2023-01-13 ENCOUNTER — Ambulatory Visit (INDEPENDENT_AMBULATORY_CARE_PROVIDER_SITE_OTHER): Payer: BC Managed Care – PPO | Admitting: Physician Assistant

## 2023-01-13 ENCOUNTER — Encounter: Payer: Self-pay | Admitting: Physician Assistant

## 2023-01-13 VITALS — BP 138/79 | HR 63 | Ht 65.0 in | Wt 169.6 lb

## 2023-01-13 DIAGNOSIS — Z0000 Encounter for general adult medical examination without abnormal findings: Secondary | ICD-10-CM | POA: Diagnosis not present

## 2023-01-13 DIAGNOSIS — M2550 Pain in unspecified joint: Secondary | ICD-10-CM

## 2023-01-13 DIAGNOSIS — Z780 Asymptomatic menopausal state: Secondary | ICD-10-CM | POA: Diagnosis not present

## 2023-01-13 NOTE — Progress Notes (Signed)
Complete physical exam  Patient: Tracy Franklin   DOB: 1970-10-10   52 y.o. Female  MRN: [id number]  Subjective:    Chief Complaint  Patient presents with   Annual Exam    Pt is here for her physical. States she has been having soreness in  joints.    Tracy Franklin is a 52 y.o. female who presents today for a complete physical exam. She reports consuming a general diet.  She is active and walks most days for exercise.   She generally feels fairly well. She reports sleeping well. She does have additional problems to discuss today.   Pt is having more multiple joint achiness. She denies any swelling or joint redness or warmth. No family hx of RA. She wonders if she can do anything about it.    Most recent fall risk assessment:    01/13/2023    8:53 AM  Fall Risk   Falls in the past year? 0  Injury with Fall? 0  Risk for fall due to : No Fall Risks  Follow up Falls evaluation completed     Most recent depression screenings:    01/13/2023    8:53 AM 09/28/2022    4:10 PM  PHQ 2/9 Scores  PHQ - 2 Score 0 0  PHQ- 9 Score  0    Vision:Within last year and Dental: No current dental problems and Receives regular dental care  Patient Active Problem List   Diagnosis Date Noted   Trochanteric bursitis, right hip 08/31/2022   Atrophic vaginitis 04/21/2022   BMI 25.0-25.9,adult 04/21/2022   Decreased platelet count (HCC) 08/13/2020   Chronic left shoulder pain 07/04/2019   Chondromalacia of patellofemoral joint, 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   Epigastric pain 06/27/2018   Nausea 06/27/2018   Right upper quadrant pain 06/27/2018   Breast hypertrophy in female 09/30/2017   Muscle spasm 09/30/2017   Upper back pain 09/30/2017   Encounter for screening mammogram for malignant neoplasm of breast 04/09/2015   Retrocalcaneal bursitis 11/02/2014   Facet arthritis of lumbar region 11/02/2014   Female climacteric state 05/07/2014   SI (sacroiliac) joint dysfunction 03/26/2014   Past Medical  History:  Diagnosis Date   Atrophic vaginitis    Past Surgical History:  Procedure Laterality Date   ABDOMINAL HYSTERECTOMY     Family Status  Relation Name Status   Father  Alive   Mother  Deceased  No partnership data on file   Family History  Problem Relation Age of Onset   Cancer Father        prostate   Hyperlipidemia Father    Early death Mother    Asthma Mother    No Known Allergies    Patient Care Team: Nolene Ebbs as PCP - General (Family Medicine)   Outpatient Medications Prior to Visit  Medication Sig   Calcium-Magnesium 250-125 MG TABS Take by mouth.   Cholecalciferol (VITAMIN D) 125 MCG (5000 UT) CAPS Take 5,000 Units by mouth.    ibuprofen (ADVIL) 200 MG tablet Take 200 mg by mouth every 6 (six) hours as needed.   Multiple Vitamins-Minerals (MULTIVITAMIN WITH MINERALS) tablet Take 1 tablet by mouth daily.   Prasterone (INTRAROSA) 6.5 MG INST Use 1 INTRAROSA vaginal insert, one time each day at bedtime   Progesterone Micronized (PROGESTERONE COMPOUNDING KIT TD) Place onto the skin.   [DISCONTINUED] HYDROcodone-acetaminophen (NORCO/VICODIN) 5-325 MG tablet Take 1 tablet by mouth every 8 (eight) hours as needed for moderate pain.   [DISCONTINUED] VITAMIN  E EX Apply topically.   No facility-administered medications prior to visit.    Review of Systems  All other systems reviewed and are negative.         Objective:     BP 138/79 (BP Location: Left Arm, Patient Position: Sitting, Cuff Size: Normal)   Pulse 63   Ht 5\' 5"  (1.651 m)   Wt 169 lb 9.6 oz (76.9 kg)   SpO2 100%   BMI 28.22 kg/m  BP Readings from Last 3 Encounters:  01/13/23 138/79  03/03/22 (!) 151/72  03/01/22 139/84   Wt Readings from Last 3 Encounters:  01/13/23 169 lb 9.6 oz (76.9 kg)  04/16/22 155 lb (70.3 kg)  03/01/22 155 lb (70.3 kg)      Physical Exam  BP 138/79 (BP Location: Left Arm, Patient Position: Sitting, Cuff Size: Normal)   Pulse 63   Ht 5\' 5"   (1.651 m)   Wt 169 lb 9.6 oz (76.9 kg)   SpO2 100%   BMI 28.22 kg/m   General Appearance:    Alert, cooperative, no distress, appears stated age  Head:    Normocephalic, without obvious abnormality, atraumatic  Eyes:    PERRL, conjunctiva/corneas clear, EOM's intact, fundi    benign, both eyes  Ears:    Normal TM's and external ear canals, both ears  Nose:   Nares normal, septum midline, mucosa normal, no drainage    or sinus tenderness  Throat:   Lips, mucosa, and tongue normal; teeth and gums normal  Neck:   Supple, symmetrical, trachea midline, no adenopathy;    thyroid:  no enlargement/tenderness/nodules; no carotid   bruit or JVD  Back:     Symmetric, no curvature, ROM normal, no CVA tenderness  Lungs:     Clear to auscultation bilaterally, respirations unlabored  Chest Wall:    No tenderness or deformity   Heart:    Regular rate and rhythm, S1 and S2 normal, no murmur, rub   or gallop     Abdomen:     Soft, non-tender, bowel sounds active all four quadrants,    no masses, no organomegaly        Extremities:   Extremities normal, atraumatic, no cyanosis or edema  Pulses:   2+ and symmetric all extremities  Skin:   Skin color, texture, turgor normal, no rashes or lesions  Lymph nodes:   Cervical, supraclavicular, and axillary nodes normal  Neurologic:   CNII-XII intact, normal strength, sensation and reflexes    throughout      Assessment & Plan:    Routine Health Maintenance and Physical Exam  Immunization History  Administered Date(s) Administered   Hep A / Hep B 05/07/2014, 06/06/2014   Influenza,inj,Quad PF,6+ Mos 05/07/2014, 05/06/2019   PFIZER(Purple Top)SARS-COV-2 Vaccination 09/05/2019, 10/13/2019, 06/28/2020   Tdap 09/30/2017   Typhoid Live 11/01/2017   Zoster Recombinant(Shingrix) 02/18/2022    Health Maintenance  Topic Date Due   COVID-19 Vaccine (4 - 2023-24 season) 01/29/2023 (Originally 02/20/2022)   HIV Screening  02/19/2023 (Originally 03/02/1985)    Zoster Vaccines- Shingrix (2 of 2) 04/15/2023 (Originally 04/15/2022)   INFLUENZA VACCINE  01/21/2023   MAMMOGRAM  05/08/2023   DTaP/Tdap/Td (2 - Td or Tdap) 10/01/2027   Colonoscopy  08/22/2028   Hepatitis C Screening  Completed   HPV VACCINES  Aged Out    Discussed health benefits of physical activity, and encouraged her to engage in regular exercise appropriate for her age and condition.  Marland KitchenDelice Bison was seen today for  annual exam.  Diagnoses and all orders for this visit:  Routine physical examination -     Sed Rate (ESR) -     TSH -     Thyroid peroxidase antibody -     CMP14+EGFR -     Lipid panel -     VITAMIN D 25 Hydroxy (Vit-D Deficiency, Fractures) -     ANA,IFA RA Diag Pnl w/rflx Tit/Patn -     CYCLIC CITRUL PEPTIDE ANTIBODY, IGG/IGA  Arthralgia, unspecified joint -     Sed Rate (ESR) -     TSH -     Thyroid peroxidase antibody -     CMP14+EGFR -     Lipid panel -     VITAMIN D 25 Hydroxy (Vit-D Deficiency, Fractures) -     ANA,IFA RA Diag Pnl w/rflx Tit/Patn -     CYCLIC CITRUL PEPTIDE ANTIBODY, IGG/IGA  Post-menopausal -     VITAMIN D 25 Hydroxy (Vit-D Deficiency, Fractures)   .Marland Kitchen Discussed 150 minutes of exercise a week.  Encouraged vitamin D 1000 units and Calcium 1300mg  or 4 servings of dairy a day.  Fasting labs ordered PHQ no concerns No need for pap, hysterectomy.  Mammogram UTD.  Colonoscopy UTD.  Reaction to first shingles vaccine, declines 2nd booster.   Discussed intermittent multiple joint arthralgia RA panel ordered Sounds more like RA Encouraged anti-inflammatory diet Trial tumeric or glucosamine chondrotin Follow up as needed.   Return in about 1 year (around 01/13/2024), or if symptoms worsen or fail to improve.     Tandy Gaw, PA-C

## 2023-01-13 NOTE — Patient Instructions (Addendum)
Trial tumeric/glucosamine chondrotin  Health Maintenance, Female Adopting a healthy lifestyle and getting preventive care are important in promoting health and wellness. Ask your health care provider about: The right schedule for you to have regular tests and exams. Things you can do on your own to prevent diseases and keep yourself healthy. What should I know about diet, weight, and exercise? Eat a healthy diet  Eat a diet that includes plenty of vegetables, fruits, low-fat dairy products, and lean protein. Do not eat a lot of foods that are high in solid fats, added sugars, or sodium. Maintain a healthy weight Body mass index (BMI) is used to identify weight problems. It estimates body fat based on height and weight. Your health care provider can help determine your BMI and help you achieve or maintain a healthy weight. Get regular exercise Get regular exercise. This is one of the most important things you can do for your health. Most adults should: Exercise for at least 150 minutes each week. The exercise should increase your heart rate and make you sweat (moderate-intensity exercise). Do strengthening exercises at least twice a week. This is in addition to the moderate-intensity exercise. Spend less time sitting. Even light physical activity can be beneficial. Watch cholesterol and blood lipids Have your blood tested for lipids and cholesterol at 53 years of age, then have this test every 5 years. Have your cholesterol levels checked more often if: Your lipid or cholesterol levels are high. You are older than 53 years of age. You are at high risk for heart disease. What should I know about cancer screening? Depending on your health history and family history, you may need to have cancer screening at various ages. This may include screening for: Breast cancer. Cervical cancer. Colorectal cancer. Skin cancer. Lung cancer. What should I know about heart disease, diabetes, and high  blood pressure? Blood pressure and heart disease High blood pressure causes heart disease and increases the risk of stroke. This is more likely to develop in people who have high blood pressure readings or are overweight. Have your blood pressure checked: Every 3-5 years if you are 61-36 years of age. Every year if you are 62 years old or older. Diabetes Have regular diabetes screenings. This checks your fasting blood sugar level. Have the screening done: Once every three years after age 32 if you are at a normal weight and have a low risk for diabetes. More often and at a younger age if you are overweight or have a high risk for diabetes. What should I know about preventing infection? Hepatitis B If you have a higher risk for hepatitis B, you should be screened for this virus. Talk with your health care provider to find out if you are at risk for hepatitis B infection. Hepatitis C Testing is recommended for: Everyone born from [birth year] through 1966. Anyone with known risk factors for hepatitis C. Sexually transmitted infections (STIs) Get screened for STIs, including gonorrhea and chlamydia, if: You are sexually active and are younger than 53 years of age. You are older than 53 years of age and your health care provider tells you that you are at risk for this type of infection. Your sexual activity has changed since you were last screened, and you are at increased risk for chlamydia or gonorrhea. Ask your health care provider if you are at risk. Ask your health care provider about whether you are at high risk for HIV. Your health care provider may recommend a prescription medicine  to help prevent HIV infection. If you choose to take medicine to prevent HIV, you should first get tested for HIV. You should then be tested every 3 months for as long as you are taking the medicine. Pregnancy If you are about to stop having your period (premenopausal) and you may become pregnant, seek counseling  before you get pregnant. Take 400 to 800 micrograms (mcg) of folic acid every day if you become pregnant. Ask for birth control (contraception) if you want to prevent pregnancy. Osteoporosis and menopause Osteoporosis is a disease in which the bones lose minerals and strength with aging. This can result in bone fractures. If you are 69 years old or older, or if you are at risk for osteoporosis and fractures, ask your health care provider if you should: Be screened for bone loss. Take a calcium or vitamin D supplement to lower your risk of fractures. Be given hormone replacement therapy (HRT) to treat symptoms of menopause. Follow these instructions at home: Alcohol use Do not drink alcohol if: Your health care provider tells you not to drink. You are pregnant, may be pregnant, or are planning to become pregnant. If you drink alcohol: Limit how much you have to: 0-1 drink a day. Know how much alcohol is in your drink. In the [place] one drink equals one 12 oz bottle of beer (355 mL), one 5 oz glass of wine (148 mL), or one 1 oz glass of hard liquor (44 mL). Lifestyle Do not use any products that contain nicotine or tobacco. These products include cigarettes, chewing tobacco, and vaping devices, such as e-cigarettes. If you need help quitting, ask your health care provider. Do not use street drugs. Do not share needles. Ask your health care provider for help if you need support or information about quitting drugs. General instructions Schedule regular health, dental, and eye exams. Stay current with your vaccines. Tell your health care provider if: You often feel depressed. You have ever been abused or do not feel safe at home. Summary Adopting a healthy lifestyle and getting preventive care are important in promoting health and wellness. Follow your health care provider's instructions about healthy diet, exercising, and getting tested or screened for diseases. Follow your health care  provider's instructions on monitoring your cholesterol and blood pressure. This information is not intended to replace advice given to you by your health care provider. Make sure you discuss any questions you have with your health care provider. Document Revised: 10/28/2020 Document Reviewed: 10/28/2020 Elsevier Patient Education  2024 [employer].

## 2023-01-14 LAB — LIPID PANEL
HDL: 75 mg/dL (ref >39)
LDL Chol Calc (NIH): 120 mg/dL — ABNORMAL HIGH (ref 0–99)
Triglycerides: 71 mg/dL (ref 0–149)
VLDL Cholesterol Cal: 13 mg/dL (ref 5–40)

## 2023-01-14 LAB — CMP14+EGFR
ALT: 20 IU/L (ref 0–32)
AST: 21 IU/L (ref 0–40)
Albumin: 4.7 g/dL (ref 3.8–4.9)
Alkaline Phosphatase: 67 IU/L (ref 44–121)
BUN/Creatinine Ratio: 17 (ref 9–23)
BUN: 14 mg/dL (ref 6–24)
Bilirubin Total: 0.4 mg/dL (ref 0.0–1.2)
CO2: 23 mmol/L (ref 20–29)
Calcium: 9.6 mg/dL (ref 8.7–10.2)
Chloride: 103 mmol/L (ref 96–106)
Creatinine, Ser: 0.82 mg/dL (ref 0.57–1.00)
Globulin, Total: 2.6 g/dL (ref 1.5–4.5)
Glucose: 89 mg/dL (ref 70–99)
Potassium: 4.4 mmol/L (ref 3.5–5.2)
Sodium: 141 mmol/L (ref 134–144)
Total Protein: 7.3 g/dL (ref 6.0–8.5)
eGFR: 86 mL/min/{1.73_m2} (ref >59)

## 2023-01-14 LAB — ANA,IFA RA DIAG PNL W/RFLX TIT/PATN: Rheumatoid fact SerPl-aCnc: 10.4 IU/mL (ref <14.0)

## 2023-01-14 LAB — TSH: TSH: 1.93 u[IU]/mL (ref 0.450–4.500)

## 2023-01-14 LAB — VITAMIN D 25 HYDROXY (VIT D DEFICIENCY, FRACTURES): Vit D, 25-Hydroxy: 54.4 ng/mL (ref 30.0–100.0)

## 2023-01-14 LAB — THYROID PEROXIDASE ANTIBODY: Thyroperoxidase Ab SerPl-aCnc: <9 IU/mL (ref 0–34)

## 2023-01-14 LAB — SEDIMENTATION RATE: Sed Rate: 3 mm/hr (ref 0–40)

## 2023-01-15 DIAGNOSIS — M2550 Pain in unspecified joint: Secondary | ICD-10-CM | POA: Insufficient documentation

## 2023-01-15 LAB — LIPID PANEL: Cholesterol, Total: 208 mg/dL — ABNORMAL HIGH (ref 100–199)

## 2023-01-15 NOTE — Progress Notes (Signed)
Rheumatoid arthritis work up was negative. Continue same treatment plan.

## 2023-01-15 NOTE — Progress Notes (Signed)
Ulani,   Thyroid looks great.  No antibodies to thyroid.  Kidney, liver, glucose looks good.  Vitamin d looks great.  HDL, good cholesterol, looks great.  LDL, bad cholesterol, is 120 and above optimal.  Overall 10 year risk is still low. Continue working on [employer] and regular exercise. Will recheck next year.   Marland Kitchen.The 10-year ASCVD risk score (Arnett DK, et al., 2019) is: 1.3%   Values used to calculate the score:     Age: 52 years     Sex: Female     Is Non-Hispanic African American: No     Diabetic: No     Tobacco smoker: No     Systolic Blood Pressure: 138 mmHg     Is BP treated: No     HDL Cholesterol: 75 mg/dL     Total Cholesterol: 208 mg/dL  Normal inflammatory marker.  Waiting for full RA panel to result.

## 2023-03-11 ENCOUNTER — Ambulatory Visit: Payer: BC Managed Care – PPO | Admitting: Internal Medicine

## 2023-03-11 ENCOUNTER — Encounter: Payer: Self-pay | Admitting: Internal Medicine

## 2023-03-11 VITALS — BP 118/80 | HR 68 | Temp 97.4°F | Ht 65.0 in | Wt 165.6 lb

## 2023-03-11 DIAGNOSIS — G4709 Other insomnia: Secondary | ICD-10-CM

## 2023-03-11 DIAGNOSIS — N951 Menopausal and female climacteric states: Secondary | ICD-10-CM | POA: Diagnosis not present

## 2023-03-11 NOTE — Progress Notes (Signed)
I,Victoria T Deloria Lair, CMA,acting as a Neurosurgeon for Gwynneth Aliment, MD.,have documented all relevant documentation on the behalf of Gwynneth Aliment, MD,as directed by  Gwynneth Aliment, MD while in the presence of Gwynneth Aliment, MD.  Subjective:  Patient ID: Tracy Franklin , female    DOB: 1970-08-03 , 52 y.o.   MRN: [id number]  Chief Complaint  Patient presents with   BHRT    HPI  She presents for BHRT f/u. She feels well on her current regimen. She was started on Intrarosa to address vaginal dryness.  She has had some improvement in her sx. She is still using Progesterone supplementation as well.           Past Medical History:  Diagnosis Date   Atrophic vaginitis      Family History  Problem Relation Age of Onset   Cancer Father        prostate   Hyperlipidemia Father    Early death Mother    Asthma Mother      Current Outpatient Medications:    Calcium-Magnesium 250-125 MG TABS, Take by mouth., Disp: , Rfl:    Cholecalciferol (VITAMIN D) 125 MCG (5000 UT) CAPS, Take 5,000 Units by mouth. , Disp: , Rfl:    Doxepin HCl 3 MG TABS, One tab po qhs, Disp: 30 tablet, Rfl: 0   ibuprofen (ADVIL) 200 MG tablet, Take 200 mg by mouth every 6 (six) hours as needed., Disp: , Rfl:    Multiple Vitamins-Minerals (MULTIVITAMIN WITH MINERALS) tablet, Take 1 tablet by mouth daily., Disp: , Rfl:    Prasterone (INTRAROSA) 6.5 MG INST, Use 1 INTRAROSA vaginal insert, one time each day at bedtime, Disp: 90 each, Rfl: 1   Progesterone Micronized (PROGESTERONE COMPOUNDING KIT TD), Place onto the skin., Disp: , Rfl:    No Known Allergies   Review of Systems  Constitutional: Negative.   Respiratory: Negative.    Cardiovascular: Negative.   Gastrointestinal: Negative.   Neurological: Negative.   Psychiatric/Behavioral: Negative.       Today's Vitals   03/11/23 1450  BP: 118/80  Pulse: 68  Temp: (!) 97.4 F (36.3 C)  SpO2: 98%  Weight: 165 lb 9.6 oz (75.1 kg)  Height: 5\' 5"  (1.651 m)    Body mass index is 27.56 kg/m.  Wt Readings from Last 3 Encounters:  03/11/23 165 lb 9.6 oz (75.1 kg)  01/13/23 169 lb 9.6 oz (76.9 kg)  04/16/22 155 lb (70.3 kg)    The 10-year ASCVD risk score (Arnett DK, et al., 2019) is: 1.1%   Values used to calculate the score:     Age: 28 years     Sex: Female     Is Non-Hispanic African American: No     Diabetic: No     Tobacco smoker: No     Systolic Blood Pressure: 118 mmHg     Is BP treated: No     HDL Cholesterol: 75 mg/dL     Total Cholesterol: 208 mg/dL   Objective:  Physical Exam Vitals and nursing note reviewed.  Constitutional:      Appearance: Normal appearance.  HENT:     Head: Normocephalic and atraumatic.  Eyes:     Extraocular Movements: Extraocular movements intact.  Cardiovascular:     Rate and Rhythm: Normal rate and regular rhythm.     Heart sounds: Normal heart sounds.  Pulmonary:     Effort: Pulmonary effort is normal.     Breath sounds: Normal breath  sounds.  Musculoskeletal:     Cervical back: Normal range of motion.  Skin:    General: Skin is warm.  Neurological:     General: No focal deficit present.     Mental Status: She is alert.  Psychiatric:        Mood and Affect: Mood normal.        Behavior: Behavior normal.         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 She will c/w prog cream, down to 1/4cc nightly except Sundays. Refills will be called into [facility].  Encouraged to stay UTD w/ annual mammograms and to perform monthly self breast exams. She will f/u in six months for re-evaluation.    Other insomnia Assessment & Plan: Chronic, likely exacerbated by menopause. She does not wish to take something that can be addictive.  We will try doxepin nightly. Will send rx to the pharmacy. Can also speak to PCP about other options if this is ineffective.    Other orders -     Doxepin  HCl; One tab po qhs  Dispense: 30 tablet; Refill: 0     Return for 6 MONTH BHRT f/u. Marland Kitchen  Patient was given opportunity to ask questions. Patient verbalized understanding of the plan and was able to repeat key elements of the plan. All questions were answered to their satisfaction.    I, Gwynneth Aliment, MD, have reviewed all documentation for this visit. The documentation on 03/11/23 for the exam, diagnosis, procedures, and orders are all accurate and complete.   IF YOU HAVE BEEN REFERRED TO A SPECIALIST, IT MAY TAKE 1-2 WEEKS TO SCHEDULE/PROCESS THE REFERRAL. IF YOU HAVE NOT HEARD FROM US/SPECIALIST IN TWO WEEKS, PLEASE GIVE [place] A CALL AT [phone].   THE PATIENT IS ENCOURAGED TO PRACTICE SOCIAL DISTANCING DUE TO THE COVID-19 PANDEMIC.

## 2023-03-11 NOTE — Patient Instructions (Signed)
Doxepin trazodone  Insomnia Insomnia is a sleep disorder that makes it difficult to fall asleep or stay asleep. Insomnia can cause fatigue, low energy, difficulty concentrating, mood swings, and poor performance at work or school. There are three different ways to classify insomnia: Difficulty falling asleep. Difficulty staying asleep. Waking up too early in the morning. Any type of insomnia can be long-term (chronic) or short-term (acute). Both are common. Short-term insomnia usually lasts for 3 months or less. Chronic insomnia occurs at least three times a week for longer than 3 months. What are the causes? Insomnia may be caused by another condition, situation, or substance, such as: Having certain mental health conditions, such as anxiety and depression. Using caffeine, alcohol, tobacco, or drugs. Having gastrointestinal conditions, such as gastroesophageal reflux disease (GERD). Having certain medical conditions. These include: Asthma. Alzheimer's disease. Stroke. Chronic pain. An overactive thyroid gland (hyperthyroidism). Other sleep disorders, such as restless legs syndrome and sleep apnea. Menopause. Sometimes, the cause of insomnia may not be known. What increases the risk? Risk factors for insomnia include: Gender. Females are affected more often than males. Age. Insomnia is more common as people get older. Stress and certain medical and mental health conditions. Lack of exercise. Having an irregular work schedule. This may include working night shifts and traveling between different time zones. What are the signs or symptoms? If you have insomnia, the main symptom is having trouble falling asleep or having trouble staying asleep. This may lead to other symptoms, such as: Feeling tired or having low energy. Feeling nervous about going to sleep. Not feeling rested in the morning. Having trouble concentrating. Feeling irritable, anxious, or depressed. How is this  diagnosed? This condition may be diagnosed based on: Your symptoms and medical history. Your health care provider may ask about: Your sleep habits. Any medical conditions you have. Your mental health. A physical exam. How is this treated? Treatment for insomnia depends on the cause. Treatment may focus on treating an underlying condition that is causing the insomnia. Treatment may also include: Medicines to help you sleep. Counseling or therapy. Lifestyle adjustments to help you sleep better. Follow these instructions at home: Eating and drinking  Limit or avoid alcohol, caffeinated beverages, and products that contain nicotine and tobacco, especially close to bedtime. These can disrupt your sleep. Do not eat a large meal or eat spicy foods right before bedtime. This can lead to digestive discomfort that can make it hard for you to sleep. Sleep habits  Keep a sleep diary to help you and your health care provider figure out what could be causing your insomnia. Write down: When you sleep. When you wake up during the night. How well you sleep and how rested you feel the next day. Any side effects of medicines you are taking. What you eat and drink. Make your bedroom a dark, comfortable place where it is easy to fall asleep. Put up shades or blackout curtains to block light from outside. Use a white noise machine to block noise. Keep the temperature cool. Limit screen use before bedtime. This includes: Not watching TV. Not using your smartphone, tablet, or computer. Stick to a routine that includes going to bed and waking up at the same times every day and night. This can help you fall asleep faster. Consider making a quiet activity, such as reading, part of your nighttime routine. Try to avoid taking naps during the day so that you sleep better at night. Get out of bed if you are  still awake after 15 minutes of trying to sleep. Keep the lights down, but try reading or doing a quiet  activity. When you feel sleepy, go back to bed. General instructions Take over-the-counter and prescription medicines only as told by your health care provider. Exercise regularly as told by your health care provider. However, avoid exercising in the hours right before bedtime. Use relaxation techniques to manage stress. Ask your health care provider to suggest some techniques that may work well for you. These may include: Breathing exercises. Routines to release muscle tension. Visualizing peaceful scenes. Make sure that you drive carefully. Do not drive if you feel very sleepy. Keep all follow-up visits. This is important. Contact a health care provider if: You are tired throughout the day. You have trouble in your daily routine due to sleepiness. You continue to have sleep problems, or your sleep problems get worse. Get help right away if: You have thoughts about hurting yourself or someone else. Get help right away if you feel like you may hurt yourself or others, or have thoughts about taking your own life. Go to your nearest emergency room or: Call 911. Call the National Suicide Prevention Lifeline at 320-570-3787 or 988. This is open 24 hours a day. Text the Crisis Text Line at 6102234127. Summary Insomnia is a sleep disorder that makes it difficult to fall asleep or stay asleep. Insomnia can be long-term (chronic) or short-term (acute). Treatment for insomnia depends on the cause. Treatment may focus on treating an underlying condition that is causing the insomnia. Keep a sleep diary to help you and your health care provider figure out what could be causing your insomnia. This information is not intended to replace advice given to you by your health care provider. Make sure you discuss any questions you have with your health care provider. Document Revised: 05/19/2021 Document Reviewed: 05/19/2021 Elsevier Patient Education  2024 [employer].

## 2023-03-14 ENCOUNTER — Encounter: Payer: Self-pay | Admitting: Internal Medicine

## 2023-03-14 DIAGNOSIS — G4709 Other insomnia: Secondary | ICD-10-CM | POA: Insufficient documentation

## 2023-03-14 MED ORDER — DOXEPIN HCL 3 MG PO TABS: ORAL_TABLET | ORAL | 0 refills | Status: AC

## 2023-03-14 NOTE — Assessment & Plan Note (Signed)
Chronic, likely exacerbated by menopause. She does not wish to take something that can be addictive.  We will try doxepin nightly. Will send rx to the pharmacy. Can also speak to PCP about other options if this is ineffective.

## 2023-03-14 NOTE — Assessment & Plan Note (Addendum)
Chronic, she prefers to continue with progesterone supplementation. She will c/w prog cream, down to 1/4cc nightly except Sundays. Refills will be called into [facility].  Encouraged to stay UTD w/ annual mammograms and to perform monthly self breast exams. She will f/u in six months for re-evaluation.

## 2023-07-12 ENCOUNTER — Encounter: Payer: Self-pay | Admitting: Physician Assistant

## 2023-07-12 LAB — HM MAMMOGRAPHY

## 2023-09-08 ENCOUNTER — Ambulatory Visit: Payer: BC Managed Care – PPO | Admitting: Internal Medicine

## 2023-09-14 ENCOUNTER — Encounter: Payer: Self-pay | Admitting: Physician Assistant

## 2023-09-15 MED ORDER — HYDROXYZINE HCL 50 MG PO TABS: 50.0000 mg | ORAL_TABLET | Freq: Every evening | ORAL | 0 refills | Status: AC | PRN

## 2023-12-05 ENCOUNTER — Encounter: Payer: Self-pay | Admitting: Physician Assistant

## 2023-12-06 MED ORDER — ACETAZOLAMIDE 125 MG PO TABS: ORAL_TABLET | ORAL | 0 refills | Status: AC

## 2023-12-08 ENCOUNTER — Encounter: Payer: Self-pay | Admitting: Physician Assistant

## 2023-12-08 ENCOUNTER — Ambulatory Visit (INDEPENDENT_AMBULATORY_CARE_PROVIDER_SITE_OTHER): Admitting: Physician Assistant

## 2023-12-08 VITALS — BP 128/61 | HR 68 | Wt 162.0 lb

## 2023-12-08 DIAGNOSIS — J029 Acute pharyngitis, unspecified: Secondary | ICD-10-CM | POA: Diagnosis not present

## 2023-12-08 LAB — POCT RAPID STREP A (OFFICE): Rapid Strep A Screen: NEGATIVE

## 2023-12-08 MED ORDER — AMOXICILLIN-POT CLAVULANATE 875-125 MG PO TABS
1.0000 | ORAL_TABLET | Freq: Two times a day (BID) | ORAL | 0 refills | Status: AC
Start: 2023-12-08 — End: ?

## 2023-12-08 MED ORDER — METHYLPREDNISOLONE 4 MG PO TBPK
ORAL_TABLET | ORAL | 0 refills | Status: AC
Start: 2023-12-08 — End: ?

## 2023-12-08 NOTE — Progress Notes (Signed)
   Acute Office Visit  Subjective:     Patient ID: Tracy Franklin, female    DOB: March 07, 1971, 53 y.o.   MRN: [id number]  HPI Patient is in today for sore throat and dry cough since Saturday night, 3 days ago. She admits to hiking that day and using O2 in a can to practice for the elevation of [place] . Her throat began to feel sore that night. She denies any fever, chills, sinus pressure, ear pain. She does have a dry cough. She has not tried anything to make better. She is concerned because she leaves for [place]  tonight.   ROS  See HPI.     Objective:    BP 128/61   Pulse 68   Wt 162 lb (73.5 kg)   SpO2 100%   BMI 26.96 kg/m  BP Readings from Last 3 Encounters:  12/08/23 128/61  03/11/23 118/80  01/13/23 138/79   Wt Readings from Last 3 Encounters:  12/08/23 162 lb (73.5 kg)  03/11/23 165 lb 9.6 oz (75.1 kg)  01/13/23 169 lb 9.6 oz (76.9 kg)      Physical Exam Constitutional:      Appearance: Normal appearance.  HENT:     Head: Normocephalic.     Right Ear: Tympanic membrane, ear canal and external ear normal. There is no impacted cerumen.     Left Ear: Tympanic membrane, ear canal and external ear normal. There is no impacted cerumen.     Nose: Nose normal. No congestion or rhinorrhea.     Mouth/Throat:     Mouth: Mucous membranes are moist.     Pharynx: Posterior oropharyngeal erythema present.     Comments: Tonsils normal size with no exudate but I do see some post nasal discharge that is clear to white on oropharynx.   Eyes:     Conjunctiva/sclera: Conjunctivae normal.    Cardiovascular:     Rate and Rhythm: Normal rate.  Pulmonary:     Effort: Pulmonary effort is normal.   Musculoskeletal:     Cervical back: No tenderness.  Lymphadenopathy:     Cervical: No cervical adenopathy.   Neurological:     General: No focal deficit present.     Mental Status: She is alert.   .. Results for orders placed or performed in visit on 12/08/23  POCT rapid  strep A   Collection Time: 12/08/23 11:58 AM  Result Value Ref Range   Rapid Strep A Screen Negative Negative       Assessment & Plan:  Aaron AasAaron AasAshlyne was seen today for cough.  Diagnoses and all orders for this visit:  Sore throat -     POCT rapid strep A -     amoxicillin -clavulanate (AUGMENTIN ) 875-125 MG tablet; Take 1 tablet by mouth 2 (two) times daily. -     methylPREDNISolone  (MEDROL  DOSEPAK) 4 MG TBPK tablet; Take as directed by package insert.  Rapid strep negative Discussed I do not think this is a bacterial infection Likely irritant from canned O2, viral, or allergic Symptomatic care with hydration, humidifer, nasal ayr spray, and salt water gargles could be helpful.  Since traveling I will send medrol  dose pack and augmentin  if needed over the next few days Discussed signs of inflammation vs infection.  Follow up as needed.    Favor Kreh, PA-C

## 2023-12-08 NOTE — Patient Instructions (Addendum)
 Negative for strep Gargle with salt water Consider nasal ayr to help with dryness Take augmentin  and medrol  dose pack on trip to use if symptoms worsen or persist  Sore Throat A sore throat is pain, burning, irritation, or scratchiness in the throat. When you have a sore throat, you may feel pain or tenderness in your throat when you swallow or talk. Many things can cause a sore throat, including: An infection. Seasonal allergies. Dryness in the air. Irritants, such as smoke or pollution. Radiation treatment for cancer. Gastroesophageal reflux disease (GERD). A tumor. A sore throat is often the first sign of another sickness. It may happen with other symptoms, such as coughing, sneezing, fever, and swollen neck glands. Most sore throats go away without medical treatment. Follow these instructions at home:     Medicines Take over-the-counter and prescription medicines only as told by your health care provider. Children often get sore throats. Do not give your child aspirin because of the association with Reye's syndrome. Use throat sprays to soothe your throat as told by your health care provider. Managing pain To help with pain, try: Sipping warm liquids, such as broth, herbal tea, or warm water. Eating or drinking cold or frozen liquids, such as frozen ice pops. Gargling with a mixture of salt and water 3-4 times a day or as needed. To make salt water, completely dissolve -1 tsp (3-6 g) of salt in 1 cup (237 mL) of warm water. Sucking on hard candy or throat lozenges. Putting a cool-mist humidifier in your bedroom at night to moisten the air. Sitting in the bathroom with the door closed for 5-10 minutes while you run hot water in the shower. General instructions Do not use any products that contain nicotine or tobacco. These products include cigarettes, chewing tobacco, and vaping devices, such as e-cigarettes. If you need help quitting, ask your health care provider. Rest as  needed. Drink enough fluid to keep your urine pale yellow. Wash your hands often with soap and water for at least 20 seconds. If soap and water are not available, use hand sanitizer. Contact a health care provider if: You have a fever for more than 2-3 days. You have symptoms that last for more than 2-3 days. Your throat does not get better within 7 days. You have a fever and your symptoms suddenly get worse. Get help right away if: You have difficulty breathing. You cannot swallow fluids, soft foods, or your saliva. You have increased swelling in your throat or neck. You have persistent nausea and vomiting. These symptoms may represent a serious problem that is an emergency. Do not wait to see if the symptoms will go away. Get medical help right away. Call your local emergency services (911 in the [place] Do not drive yourself to the hospital. Summary A sore throat is pain, burning, irritation, or scratchiness in the throat. Many things can cause a sore throat. Take over-the-counter medicines only as told by your health care provider. Rest as needed. Drink enough fluid to keep your urine pale yellow. Contact a health care provider if your throat does not get better within 7 days. This information is not intended to replace advice given to you by your health care provider. Make sure you discuss any questions you have with your health care provider. Document Revised: 09/04/2020 Document Reviewed: 09/04/2020 Elsevier Patient Education  2024 [employer].

## 2024-02-22 ENCOUNTER — Encounter: Payer: Self-pay | Admitting: Sports Medicine

## 2024-07-13 LAB — HM MAMMOGRAPHY

## 2024-07-17 ENCOUNTER — Encounter: Payer: Self-pay | Admitting: Physician Assistant

## 2024-07-17 ENCOUNTER — Encounter: Admitting: Physician Assistant

## 2024-07-25 ENCOUNTER — Encounter: Admitting: Physician Assistant
